# Patient Record
Sex: Female | Born: 1945
Health system: Southern US, Community
[De-identification: ages and names within clinical notes are randomized; demographics above are authoritative.]

## PROBLEM LIST (undated history)

## (undated) DIAGNOSIS — M1990 Unspecified osteoarthritis, unspecified site: Secondary | ICD-10-CM

## (undated) DIAGNOSIS — K219 Gastro-esophageal reflux disease without esophagitis: Secondary | ICD-10-CM

## (undated) DIAGNOSIS — D696 Thrombocytopenia, unspecified: Secondary | ICD-10-CM

## (undated) DIAGNOSIS — E785 Hyperlipidemia, unspecified: Secondary | ICD-10-CM

## (undated) DIAGNOSIS — E079 Disorder of thyroid, unspecified: Secondary | ICD-10-CM

## (undated) DIAGNOSIS — E0590 Thyrotoxicosis, unspecified without thyrotoxic crisis or storm: Secondary | ICD-10-CM

## (undated) DIAGNOSIS — G4730 Sleep apnea, unspecified: Secondary | ICD-10-CM

## (undated) HISTORY — PX: SHOULDER ARTHROSCOPY: SHX128

## (undated) HISTORY — DX: Unspecified osteoarthritis, unspecified site: M19.90

## (undated) HISTORY — DX: Thyrotoxicosis, unspecified without thyrotoxic crisis or storm: E05.90

## (undated) HISTORY — DX: Thrombocytopenia, unspecified: D69.6

## (undated) HISTORY — PX: CERVICAL ABLATION: SHX5771

## (undated) HISTORY — PX: CATARACT EXTRACTION W/ INTRAOCULAR LENS  IMPLANT, BILATERAL: SHX1307

## (undated) HISTORY — DX: Hyperlipidemia, unspecified: E78.5

## (undated) HISTORY — DX: Sleep apnea, unspecified: G47.30

## (undated) HISTORY — PX: COLONOSCOPY: SHX174

---

## 1998-06-02 ENCOUNTER — Other Ambulatory Visit: Admission: RE | Admit: 1998-06-02 | Discharge: 1998-06-02 | Payer: Self-pay | Admitting: Gynecology

## 1998-07-13 ENCOUNTER — Other Ambulatory Visit: Admission: RE | Admit: 1998-07-13 | Discharge: 1998-07-13 | Payer: Self-pay | Admitting: Gynecology

## 1999-06-08 ENCOUNTER — Other Ambulatory Visit: Admission: RE | Admit: 1999-06-08 | Discharge: 1999-06-08 | Payer: Self-pay | Admitting: Gynecology

## 1999-06-23 ENCOUNTER — Encounter: Payer: Self-pay | Admitting: Gynecology

## 1999-06-23 ENCOUNTER — Encounter: Admission: RE | Admit: 1999-06-23 | Discharge: 1999-06-23 | Payer: Self-pay | Admitting: Gynecology

## 1999-06-28 ENCOUNTER — Other Ambulatory Visit: Admission: RE | Admit: 1999-06-28 | Discharge: 1999-06-28 | Payer: Self-pay | Admitting: Gynecology

## 2000-03-08 ENCOUNTER — Encounter: Payer: Self-pay | Admitting: Internal Medicine

## 2000-03-08 ENCOUNTER — Encounter: Admission: RE | Admit: 2000-03-08 | Discharge: 2000-03-08 | Payer: Self-pay | Admitting: Internal Medicine

## 2000-06-07 ENCOUNTER — Other Ambulatory Visit: Admission: RE | Admit: 2000-06-07 | Discharge: 2000-06-07 | Payer: Self-pay | Admitting: Gynecology

## 2000-07-07 ENCOUNTER — Encounter: Payer: Self-pay | Admitting: Gynecology

## 2000-07-07 ENCOUNTER — Encounter: Admission: RE | Admit: 2000-07-07 | Discharge: 2000-07-07 | Payer: Self-pay | Admitting: Gynecology

## 2000-11-11 ENCOUNTER — Emergency Department (HOSPITAL_COMMUNITY): Admission: EM | Admit: 2000-11-11 | Discharge: 2000-11-11 | Payer: Self-pay | Admitting: *Deleted

## 2000-11-11 ENCOUNTER — Encounter: Payer: Self-pay | Admitting: *Deleted

## 2001-07-17 ENCOUNTER — Other Ambulatory Visit: Admission: RE | Admit: 2001-07-17 | Discharge: 2001-07-17 | Payer: Self-pay | Admitting: Gynecology

## 2001-07-27 ENCOUNTER — Encounter: Admission: RE | Admit: 2001-07-27 | Discharge: 2001-07-27 | Payer: Self-pay | Admitting: Gynecology

## 2001-07-27 ENCOUNTER — Encounter: Payer: Self-pay | Admitting: Gynecology

## 2002-05-28 ENCOUNTER — Encounter: Admission: RE | Admit: 2002-05-28 | Discharge: 2002-05-28 | Payer: Self-pay | Admitting: Orthopaedic Surgery

## 2002-05-28 ENCOUNTER — Encounter: Payer: Self-pay | Admitting: Orthopaedic Surgery

## 2002-07-31 ENCOUNTER — Encounter: Admission: RE | Admit: 2002-07-31 | Discharge: 2002-07-31 | Payer: Self-pay | Admitting: Gynecology

## 2002-07-31 ENCOUNTER — Encounter: Payer: Self-pay | Admitting: Gynecology

## 2002-08-06 ENCOUNTER — Other Ambulatory Visit: Admission: RE | Admit: 2002-08-06 | Discharge: 2002-08-06 | Payer: Self-pay | Admitting: Gynecology

## 2003-06-24 ENCOUNTER — Other Ambulatory Visit: Admission: RE | Admit: 2003-06-24 | Discharge: 2003-06-24 | Payer: Self-pay | Admitting: Gynecology

## 2003-07-09 ENCOUNTER — Encounter: Admission: RE | Admit: 2003-07-09 | Discharge: 2003-07-09 | Payer: Self-pay | Admitting: Gynecology

## 2003-08-11 ENCOUNTER — Encounter: Admission: RE | Admit: 2003-08-11 | Discharge: 2003-08-11 | Payer: Self-pay | Admitting: Gynecology

## 2004-06-29 ENCOUNTER — Other Ambulatory Visit: Admission: RE | Admit: 2004-06-29 | Discharge: 2004-06-29 | Payer: Self-pay | Admitting: Gynecology

## 2004-08-11 ENCOUNTER — Encounter: Admission: RE | Admit: 2004-08-11 | Discharge: 2004-08-11 | Payer: Self-pay | Admitting: Gynecology

## 2005-08-09 ENCOUNTER — Other Ambulatory Visit: Admission: RE | Admit: 2005-08-09 | Discharge: 2005-08-09 | Payer: Self-pay | Admitting: Gynecology

## 2005-08-15 ENCOUNTER — Encounter: Admission: RE | Admit: 2005-08-15 | Discharge: 2005-08-15 | Payer: Self-pay | Admitting: Gynecology

## 2005-08-17 ENCOUNTER — Encounter: Admission: RE | Admit: 2005-08-17 | Discharge: 2005-08-17 | Payer: Self-pay | Admitting: Internal Medicine

## 2006-08-08 ENCOUNTER — Other Ambulatory Visit: Admission: RE | Admit: 2006-08-08 | Discharge: 2006-08-08 | Payer: Self-pay | Admitting: Gynecology

## 2006-08-17 ENCOUNTER — Encounter: Admission: RE | Admit: 2006-08-17 | Discharge: 2006-08-17 | Payer: Self-pay | Admitting: Gynecology

## 2007-08-21 ENCOUNTER — Encounter: Admission: RE | Admit: 2007-08-21 | Discharge: 2007-08-21 | Payer: Self-pay | Admitting: Gynecology

## 2007-09-11 ENCOUNTER — Other Ambulatory Visit: Admission: RE | Admit: 2007-09-11 | Discharge: 2007-09-11 | Payer: Self-pay | Admitting: Gynecology

## 2007-09-20 ENCOUNTER — Encounter: Admission: RE | Admit: 2007-09-20 | Discharge: 2007-09-20 | Payer: Self-pay | Admitting: Gynecology

## 2008-09-05 ENCOUNTER — Encounter: Admission: RE | Admit: 2008-09-05 | Discharge: 2008-09-05 | Payer: Self-pay | Admitting: Gynecology

## 2009-09-16 ENCOUNTER — Encounter: Admission: RE | Admit: 2009-09-16 | Discharge: 2009-09-16 | Payer: Self-pay | Admitting: Gynecology

## 2011-05-31 DIAGNOSIS — D235 Other benign neoplasm of skin of trunk: Secondary | ICD-10-CM | POA: Diagnosis not present

## 2011-05-31 DIAGNOSIS — L570 Actinic keratosis: Secondary | ICD-10-CM | POA: Diagnosis not present

## 2011-06-21 DIAGNOSIS — H52209 Unspecified astigmatism, unspecified eye: Secondary | ICD-10-CM | POA: Diagnosis not present

## 2011-06-21 DIAGNOSIS — H16229 Keratoconjunctivitis sicca, not specified as Sjogren's, unspecified eye: Secondary | ICD-10-CM | POA: Diagnosis not present

## 2011-06-21 DIAGNOSIS — H524 Presbyopia: Secondary | ICD-10-CM | POA: Diagnosis not present

## 2011-06-21 DIAGNOSIS — H25019 Cortical age-related cataract, unspecified eye: Secondary | ICD-10-CM | POA: Diagnosis not present

## 2011-10-03 DIAGNOSIS — E559 Vitamin D deficiency, unspecified: Secondary | ICD-10-CM | POA: Diagnosis not present

## 2011-10-03 DIAGNOSIS — R82998 Other abnormal findings in urine: Secondary | ICD-10-CM | POA: Diagnosis not present

## 2011-10-03 DIAGNOSIS — E785 Hyperlipidemia, unspecified: Secondary | ICD-10-CM | POA: Diagnosis not present

## 2011-10-05 DIAGNOSIS — K5900 Constipation, unspecified: Secondary | ICD-10-CM | POA: Diagnosis not present

## 2011-10-05 DIAGNOSIS — E785 Hyperlipidemia, unspecified: Secondary | ICD-10-CM | POA: Diagnosis not present

## 2011-10-05 DIAGNOSIS — Z0000 Encounter for general adult medical examination without abnormal findings: Secondary | ICD-10-CM | POA: Diagnosis not present

## 2011-10-05 DIAGNOSIS — M899 Disorder of bone, unspecified: Secondary | ICD-10-CM | POA: Diagnosis not present

## 2011-10-13 DIAGNOSIS — Z1231 Encounter for screening mammogram for malignant neoplasm of breast: Secondary | ICD-10-CM | POA: Diagnosis not present

## 2011-10-13 DIAGNOSIS — Z1212 Encounter for screening for malignant neoplasm of rectum: Secondary | ICD-10-CM | POA: Diagnosis not present

## 2011-10-13 DIAGNOSIS — Z124 Encounter for screening for malignant neoplasm of cervix: Secondary | ICD-10-CM | POA: Diagnosis not present

## 2011-10-18 DIAGNOSIS — M949 Disorder of cartilage, unspecified: Secondary | ICD-10-CM | POA: Diagnosis not present

## 2011-10-18 DIAGNOSIS — M899 Disorder of bone, unspecified: Secondary | ICD-10-CM | POA: Diagnosis not present

## 2011-10-19 DIAGNOSIS — D485 Neoplasm of uncertain behavior of skin: Secondary | ICD-10-CM | POA: Diagnosis not present

## 2011-10-19 DIAGNOSIS — L570 Actinic keratosis: Secondary | ICD-10-CM | POA: Diagnosis not present

## 2012-02-21 DIAGNOSIS — Z23 Encounter for immunization: Secondary | ICD-10-CM | POA: Diagnosis not present

## 2012-02-21 DIAGNOSIS — D485 Neoplasm of uncertain behavior of skin: Secondary | ICD-10-CM | POA: Diagnosis not present

## 2012-04-24 DIAGNOSIS — R05 Cough: Secondary | ICD-10-CM | POA: Diagnosis not present

## 2012-04-24 DIAGNOSIS — J0100 Acute maxillary sinusitis, unspecified: Secondary | ICD-10-CM | POA: Diagnosis not present

## 2012-05-28 DIAGNOSIS — D235 Other benign neoplasm of skin of trunk: Secondary | ICD-10-CM | POA: Diagnosis not present

## 2012-05-28 DIAGNOSIS — D485 Neoplasm of uncertain behavior of skin: Secondary | ICD-10-CM | POA: Diagnosis not present

## 2012-06-05 DIAGNOSIS — H524 Presbyopia: Secondary | ICD-10-CM | POA: Diagnosis not present

## 2012-06-05 DIAGNOSIS — H16229 Keratoconjunctivitis sicca, not specified as Sjogren's, unspecified eye: Secondary | ICD-10-CM | POA: Diagnosis not present

## 2012-06-05 DIAGNOSIS — H25019 Cortical age-related cataract, unspecified eye: Secondary | ICD-10-CM | POA: Diagnosis not present

## 2012-10-02 DIAGNOSIS — E785 Hyperlipidemia, unspecified: Secondary | ICD-10-CM | POA: Diagnosis not present

## 2012-10-02 DIAGNOSIS — M949 Disorder of cartilage, unspecified: Secondary | ICD-10-CM | POA: Diagnosis not present

## 2012-10-02 DIAGNOSIS — R82998 Other abnormal findings in urine: Secondary | ICD-10-CM | POA: Diagnosis not present

## 2012-10-02 DIAGNOSIS — M899 Disorder of bone, unspecified: Secondary | ICD-10-CM | POA: Diagnosis not present

## 2012-10-02 DIAGNOSIS — Z79899 Other long term (current) drug therapy: Secondary | ICD-10-CM | POA: Diagnosis not present

## 2012-10-10 DIAGNOSIS — M545 Low back pain: Secondary | ICD-10-CM | POA: Diagnosis not present

## 2012-10-10 DIAGNOSIS — E785 Hyperlipidemia, unspecified: Secondary | ICD-10-CM | POA: Diagnosis not present

## 2012-10-10 DIAGNOSIS — Z1331 Encounter for screening for depression: Secondary | ICD-10-CM | POA: Diagnosis not present

## 2012-10-10 DIAGNOSIS — K5900 Constipation, unspecified: Secondary | ICD-10-CM | POA: Diagnosis not present

## 2012-10-10 DIAGNOSIS — Z79899 Other long term (current) drug therapy: Secondary | ICD-10-CM | POA: Diagnosis not present

## 2012-10-10 DIAGNOSIS — M899 Disorder of bone, unspecified: Secondary | ICD-10-CM | POA: Diagnosis not present

## 2012-10-10 DIAGNOSIS — Z0000 Encounter for general adult medical examination without abnormal findings: Secondary | ICD-10-CM | POA: Diagnosis not present

## 2012-10-10 DIAGNOSIS — K219 Gastro-esophageal reflux disease without esophagitis: Secondary | ICD-10-CM | POA: Diagnosis not present

## 2012-11-27 DIAGNOSIS — Z124 Encounter for screening for malignant neoplasm of cervix: Secondary | ICD-10-CM | POA: Diagnosis not present

## 2012-11-27 DIAGNOSIS — Z1231 Encounter for screening mammogram for malignant neoplasm of breast: Secondary | ICD-10-CM | POA: Diagnosis not present

## 2012-12-11 DIAGNOSIS — L821 Other seborrheic keratosis: Secondary | ICD-10-CM | POA: Diagnosis not present

## 2012-12-25 DIAGNOSIS — H25019 Cortical age-related cataract, unspecified eye: Secondary | ICD-10-CM | POA: Diagnosis not present

## 2012-12-25 DIAGNOSIS — H52229 Regular astigmatism, unspecified eye: Secondary | ICD-10-CM | POA: Diagnosis not present

## 2012-12-25 DIAGNOSIS — H5210 Myopia, unspecified eye: Secondary | ICD-10-CM | POA: Diagnosis not present

## 2012-12-25 DIAGNOSIS — H16229 Keratoconjunctivitis sicca, not specified as Sjogren's, unspecified eye: Secondary | ICD-10-CM | POA: Diagnosis not present

## 2013-02-07 DIAGNOSIS — Z23 Encounter for immunization: Secondary | ICD-10-CM | POA: Diagnosis not present

## 2013-05-21 DIAGNOSIS — R42 Dizziness and giddiness: Secondary | ICD-10-CM | POA: Diagnosis not present

## 2013-06-17 DIAGNOSIS — L821 Other seborrheic keratosis: Secondary | ICD-10-CM | POA: Diagnosis not present

## 2013-06-17 DIAGNOSIS — L820 Inflamed seborrheic keratosis: Secondary | ICD-10-CM | POA: Diagnosis not present

## 2013-10-10 DIAGNOSIS — M949 Disorder of cartilage, unspecified: Secondary | ICD-10-CM | POA: Diagnosis not present

## 2013-10-10 DIAGNOSIS — N183 Chronic kidney disease, stage 3 unspecified: Secondary | ICD-10-CM | POA: Diagnosis not present

## 2013-10-10 DIAGNOSIS — R809 Proteinuria, unspecified: Secondary | ICD-10-CM | POA: Diagnosis not present

## 2013-10-10 DIAGNOSIS — M899 Disorder of bone, unspecified: Secondary | ICD-10-CM | POA: Diagnosis not present

## 2013-10-10 DIAGNOSIS — E785 Hyperlipidemia, unspecified: Secondary | ICD-10-CM | POA: Diagnosis not present

## 2013-10-10 DIAGNOSIS — R82998 Other abnormal findings in urine: Secondary | ICD-10-CM | POA: Diagnosis not present

## 2013-10-17 DIAGNOSIS — E785 Hyperlipidemia, unspecified: Secondary | ICD-10-CM | POA: Diagnosis not present

## 2013-10-17 DIAGNOSIS — M545 Low back pain, unspecified: Secondary | ICD-10-CM | POA: Diagnosis not present

## 2013-10-17 DIAGNOSIS — Z0000 Encounter for general adult medical examination without abnormal findings: Secondary | ICD-10-CM | POA: Diagnosis not present

## 2013-10-17 DIAGNOSIS — R05 Cough: Secondary | ICD-10-CM | POA: Diagnosis not present

## 2013-10-17 DIAGNOSIS — M899 Disorder of bone, unspecified: Secondary | ICD-10-CM | POA: Diagnosis not present

## 2013-10-17 DIAGNOSIS — Z1331 Encounter for screening for depression: Secondary | ICD-10-CM | POA: Diagnosis not present

## 2013-10-17 DIAGNOSIS — J029 Acute pharyngitis, unspecified: Secondary | ICD-10-CM | POA: Diagnosis not present

## 2013-10-17 DIAGNOSIS — N183 Chronic kidney disease, stage 3 unspecified: Secondary | ICD-10-CM | POA: Diagnosis not present

## 2013-10-17 DIAGNOSIS — R059 Cough, unspecified: Secondary | ICD-10-CM | POA: Diagnosis not present

## 2013-10-17 DIAGNOSIS — R42 Dizziness and giddiness: Secondary | ICD-10-CM | POA: Diagnosis not present

## 2013-10-17 DIAGNOSIS — M949 Disorder of cartilage, unspecified: Secondary | ICD-10-CM | POA: Diagnosis not present

## 2013-10-21 DIAGNOSIS — Z1212 Encounter for screening for malignant neoplasm of rectum: Secondary | ICD-10-CM | POA: Diagnosis not present

## 2013-10-29 DIAGNOSIS — D485 Neoplasm of uncertain behavior of skin: Secondary | ICD-10-CM | POA: Diagnosis not present

## 2013-11-27 DIAGNOSIS — L253 Unspecified contact dermatitis due to other chemical products: Secondary | ICD-10-CM | POA: Diagnosis not present

## 2013-12-03 DIAGNOSIS — H2589 Other age-related cataract: Secondary | ICD-10-CM | POA: Diagnosis not present

## 2013-12-03 DIAGNOSIS — H52229 Regular astigmatism, unspecified eye: Secondary | ICD-10-CM | POA: Diagnosis not present

## 2013-12-03 DIAGNOSIS — H5210 Myopia, unspecified eye: Secondary | ICD-10-CM | POA: Diagnosis not present

## 2013-12-03 DIAGNOSIS — H524 Presbyopia: Secondary | ICD-10-CM | POA: Diagnosis not present

## 2013-12-18 DIAGNOSIS — Z1231 Encounter for screening mammogram for malignant neoplasm of breast: Secondary | ICD-10-CM | POA: Diagnosis not present

## 2013-12-18 DIAGNOSIS — N959 Unspecified menopausal and perimenopausal disorder: Secondary | ICD-10-CM | POA: Diagnosis not present

## 2013-12-18 DIAGNOSIS — M949 Disorder of cartilage, unspecified: Secondary | ICD-10-CM | POA: Diagnosis not present

## 2013-12-18 DIAGNOSIS — M899 Disorder of bone, unspecified: Secondary | ICD-10-CM | POA: Diagnosis not present

## 2013-12-18 DIAGNOSIS — Z01419 Encounter for gynecological examination (general) (routine) without abnormal findings: Secondary | ICD-10-CM | POA: Diagnosis not present

## 2013-12-20 DIAGNOSIS — D235 Other benign neoplasm of skin of trunk: Secondary | ICD-10-CM | POA: Diagnosis not present

## 2013-12-20 DIAGNOSIS — D1801 Hemangioma of skin and subcutaneous tissue: Secondary | ICD-10-CM | POA: Diagnosis not present

## 2013-12-20 DIAGNOSIS — D485 Neoplasm of uncertain behavior of skin: Secondary | ICD-10-CM | POA: Diagnosis not present

## 2014-02-07 DIAGNOSIS — Z23 Encounter for immunization: Secondary | ICD-10-CM | POA: Diagnosis not present

## 2014-06-26 DIAGNOSIS — J0100 Acute maxillary sinusitis, unspecified: Secondary | ICD-10-CM | POA: Diagnosis not present

## 2014-06-26 DIAGNOSIS — Z6821 Body mass index (BMI) 21.0-21.9, adult: Secondary | ICD-10-CM | POA: Diagnosis not present

## 2014-10-17 DIAGNOSIS — E785 Hyperlipidemia, unspecified: Secondary | ICD-10-CM | POA: Diagnosis not present

## 2014-10-17 DIAGNOSIS — N183 Chronic kidney disease, stage 3 (moderate): Secondary | ICD-10-CM | POA: Diagnosis not present

## 2014-10-17 DIAGNOSIS — M859 Disorder of bone density and structure, unspecified: Secondary | ICD-10-CM | POA: Diagnosis not present

## 2014-10-17 DIAGNOSIS — N390 Urinary tract infection, site not specified: Secondary | ICD-10-CM | POA: Diagnosis not present

## 2014-10-17 DIAGNOSIS — R8299 Other abnormal findings in urine: Secondary | ICD-10-CM | POA: Diagnosis not present

## 2014-10-24 DIAGNOSIS — Z1389 Encounter for screening for other disorder: Secondary | ICD-10-CM | POA: Diagnosis not present

## 2014-10-24 DIAGNOSIS — Z0000 Encounter for general adult medical examination without abnormal findings: Secondary | ICD-10-CM | POA: Diagnosis not present

## 2014-10-24 DIAGNOSIS — M859 Disorder of bone density and structure, unspecified: Secondary | ICD-10-CM | POA: Diagnosis not present

## 2014-10-24 DIAGNOSIS — K645 Perianal venous thrombosis: Secondary | ICD-10-CM | POA: Diagnosis not present

## 2014-10-24 DIAGNOSIS — K219 Gastro-esophageal reflux disease without esophagitis: Secondary | ICD-10-CM | POA: Diagnosis not present

## 2014-10-24 DIAGNOSIS — M545 Low back pain: Secondary | ICD-10-CM | POA: Diagnosis not present

## 2014-10-24 DIAGNOSIS — Z23 Encounter for immunization: Secondary | ICD-10-CM | POA: Diagnosis not present

## 2014-10-24 DIAGNOSIS — Z1212 Encounter for screening for malignant neoplasm of rectum: Secondary | ICD-10-CM | POA: Diagnosis not present

## 2014-10-24 DIAGNOSIS — Z6820 Body mass index (BMI) 20.0-20.9, adult: Secondary | ICD-10-CM | POA: Diagnosis not present

## 2014-10-24 DIAGNOSIS — E785 Hyperlipidemia, unspecified: Secondary | ICD-10-CM | POA: Diagnosis not present

## 2014-10-24 DIAGNOSIS — K5900 Constipation, unspecified: Secondary | ICD-10-CM | POA: Diagnosis not present

## 2014-12-16 DIAGNOSIS — H524 Presbyopia: Secondary | ICD-10-CM | POA: Diagnosis not present

## 2014-12-16 DIAGNOSIS — H5213 Myopia, bilateral: Secondary | ICD-10-CM | POA: Diagnosis not present

## 2014-12-16 DIAGNOSIS — H52223 Regular astigmatism, bilateral: Secondary | ICD-10-CM | POA: Diagnosis not present

## 2014-12-16 DIAGNOSIS — H25813 Combined forms of age-related cataract, bilateral: Secondary | ICD-10-CM | POA: Diagnosis not present

## 2015-01-14 DIAGNOSIS — L570 Actinic keratosis: Secondary | ICD-10-CM | POA: Diagnosis not present

## 2015-01-14 DIAGNOSIS — D225 Melanocytic nevi of trunk: Secondary | ICD-10-CM | POA: Diagnosis not present

## 2015-01-14 DIAGNOSIS — D485 Neoplasm of uncertain behavior of skin: Secondary | ICD-10-CM | POA: Diagnosis not present

## 2015-01-14 DIAGNOSIS — D1801 Hemangioma of skin and subcutaneous tissue: Secondary | ICD-10-CM | POA: Diagnosis not present

## 2015-01-14 DIAGNOSIS — L821 Other seborrheic keratosis: Secondary | ICD-10-CM | POA: Diagnosis not present

## 2015-01-14 DIAGNOSIS — L814 Other melanin hyperpigmentation: Secondary | ICD-10-CM | POA: Diagnosis not present

## 2015-01-26 DIAGNOSIS — R140 Abdominal distension (gaseous): Secondary | ICD-10-CM | POA: Diagnosis not present

## 2015-01-26 DIAGNOSIS — M5136 Other intervertebral disc degeneration, lumbar region: Secondary | ICD-10-CM | POA: Diagnosis not present

## 2015-01-26 DIAGNOSIS — M5137 Other intervertebral disc degeneration, lumbosacral region: Secondary | ICD-10-CM | POA: Diagnosis not present

## 2015-01-26 DIAGNOSIS — R29898 Other symptoms and signs involving the musculoskeletal system: Secondary | ICD-10-CM | POA: Diagnosis not present

## 2015-01-26 DIAGNOSIS — K449 Diaphragmatic hernia without obstruction or gangrene: Secondary | ICD-10-CM | POA: Diagnosis not present

## 2015-01-26 DIAGNOSIS — Z23 Encounter for immunization: Secondary | ICD-10-CM | POA: Diagnosis not present

## 2015-01-26 DIAGNOSIS — R251 Tremor, unspecified: Secondary | ICD-10-CM | POA: Diagnosis not present

## 2015-01-26 DIAGNOSIS — Z6820 Body mass index (BMI) 20.0-20.9, adult: Secondary | ICD-10-CM | POA: Diagnosis not present

## 2015-01-28 DIAGNOSIS — R140 Abdominal distension (gaseous): Secondary | ICD-10-CM | POA: Diagnosis not present

## 2015-01-28 DIAGNOSIS — K449 Diaphragmatic hernia without obstruction or gangrene: Secondary | ICD-10-CM | POA: Diagnosis not present

## 2015-01-29 DIAGNOSIS — Z6820 Body mass index (BMI) 20.0-20.9, adult: Secondary | ICD-10-CM | POA: Diagnosis not present

## 2015-01-29 DIAGNOSIS — Z1231 Encounter for screening mammogram for malignant neoplasm of breast: Secondary | ICD-10-CM | POA: Diagnosis not present

## 2015-01-29 DIAGNOSIS — Z124 Encounter for screening for malignant neoplasm of cervix: Secondary | ICD-10-CM | POA: Diagnosis not present

## 2015-01-29 DIAGNOSIS — Z1212 Encounter for screening for malignant neoplasm of rectum: Secondary | ICD-10-CM | POA: Diagnosis not present

## 2015-02-18 ENCOUNTER — Other Ambulatory Visit: Payer: Self-pay | Admitting: Internal Medicine

## 2015-02-18 DIAGNOSIS — R140 Abdominal distension (gaseous): Secondary | ICD-10-CM | POA: Diagnosis not present

## 2015-02-18 DIAGNOSIS — R634 Abnormal weight loss: Secondary | ICD-10-CM | POA: Diagnosis not present

## 2015-02-18 DIAGNOSIS — Z6820 Body mass index (BMI) 20.0-20.9, adult: Secondary | ICD-10-CM | POA: Diagnosis not present

## 2015-02-18 DIAGNOSIS — R05 Cough: Secondary | ICD-10-CM | POA: Diagnosis not present

## 2015-02-18 DIAGNOSIS — R1310 Dysphagia, unspecified: Secondary | ICD-10-CM

## 2015-02-18 DIAGNOSIS — M545 Low back pain: Secondary | ICD-10-CM | POA: Diagnosis not present

## 2015-02-24 ENCOUNTER — Ambulatory Visit
Admission: RE | Admit: 2015-02-24 | Discharge: 2015-02-24 | Disposition: A | Discharge status: Home / Self Care | Payer: Medicare Other | Source: Ambulatory Visit | Attending: Internal Medicine | Admitting: Internal Medicine

## 2015-02-24 DIAGNOSIS — R140 Abdominal distension (gaseous): Secondary | ICD-10-CM

## 2015-02-24 DIAGNOSIS — R634 Abnormal weight loss: Secondary | ICD-10-CM | POA: Diagnosis not present

## 2015-02-24 MED ORDER — IOPAMIDOL (ISOVUE-300) INJECTION 61%
100.0000 mL | Freq: Once | INTRAVENOUS | Status: AC | PRN
  Administered 2015-02-24: 100 mL via INTRAVENOUS

## 2015-02-26 ENCOUNTER — Other Ambulatory Visit: Payer: Self-pay | Admitting: Internal Medicine

## 2015-02-26 DIAGNOSIS — R1310 Dysphagia, unspecified: Secondary | ICD-10-CM

## 2015-02-27 ENCOUNTER — Ambulatory Visit
Admission: RE | Admit: 2015-02-27 | Discharge: 2015-02-27 | Disposition: A | Discharge status: Home / Self Care | Payer: Medicare Other | Source: Ambulatory Visit | Attending: Internal Medicine | Admitting: Internal Medicine

## 2015-02-27 DIAGNOSIS — K224 Dyskinesia of esophagus: Secondary | ICD-10-CM | POA: Diagnosis not present

## 2015-02-27 DIAGNOSIS — R1310 Dysphagia, unspecified: Secondary | ICD-10-CM

## 2015-03-20 DIAGNOSIS — R634 Abnormal weight loss: Secondary | ICD-10-CM | POA: Diagnosis not present

## 2015-03-20 DIAGNOSIS — R140 Abdominal distension (gaseous): Secondary | ICD-10-CM | POA: Diagnosis not present

## 2015-03-20 DIAGNOSIS — Z6821 Body mass index (BMI) 21.0-21.9, adult: Secondary | ICD-10-CM | POA: Diagnosis not present

## 2015-03-20 DIAGNOSIS — E0590 Thyrotoxicosis, unspecified without thyrotoxic crisis or storm: Secondary | ICD-10-CM | POA: Diagnosis not present

## 2015-07-07 DIAGNOSIS — E0590 Thyrotoxicosis, unspecified without thyrotoxic crisis or storm: Secondary | ICD-10-CM | POA: Diagnosis not present

## 2015-07-07 DIAGNOSIS — Z6821 Body mass index (BMI) 21.0-21.9, adult: Secondary | ICD-10-CM | POA: Diagnosis not present

## 2015-07-07 DIAGNOSIS — R1310 Dysphagia, unspecified: Secondary | ICD-10-CM | POA: Diagnosis not present

## 2015-09-02 DIAGNOSIS — D1801 Hemangioma of skin and subcutaneous tissue: Secondary | ICD-10-CM | POA: Diagnosis not present

## 2015-09-02 DIAGNOSIS — L820 Inflamed seborrheic keratosis: Secondary | ICD-10-CM | POA: Diagnosis not present

## 2015-09-02 DIAGNOSIS — L821 Other seborrheic keratosis: Secondary | ICD-10-CM | POA: Diagnosis not present

## 2015-09-02 DIAGNOSIS — L570 Actinic keratosis: Secondary | ICD-10-CM | POA: Diagnosis not present

## 2015-10-02 DIAGNOSIS — L820 Inflamed seborrheic keratosis: Secondary | ICD-10-CM | POA: Diagnosis not present

## 2015-10-02 DIAGNOSIS — L570 Actinic keratosis: Secondary | ICD-10-CM | POA: Diagnosis not present

## 2015-11-04 DIAGNOSIS — N183 Chronic kidney disease, stage 3 (moderate): Secondary | ICD-10-CM | POA: Diagnosis not present

## 2015-11-04 DIAGNOSIS — E784 Other hyperlipidemia: Secondary | ICD-10-CM | POA: Diagnosis not present

## 2015-11-04 DIAGNOSIS — N390 Urinary tract infection, site not specified: Secondary | ICD-10-CM | POA: Diagnosis not present

## 2015-11-04 DIAGNOSIS — E0590 Thyrotoxicosis, unspecified without thyrotoxic crisis or storm: Secondary | ICD-10-CM | POA: Diagnosis not present

## 2015-11-04 DIAGNOSIS — M859 Disorder of bone density and structure, unspecified: Secondary | ICD-10-CM | POA: Diagnosis not present

## 2015-11-04 DIAGNOSIS — R8299 Other abnormal findings in urine: Secondary | ICD-10-CM | POA: Diagnosis not present

## 2015-11-06 DIAGNOSIS — E0590 Thyrotoxicosis, unspecified without thyrotoxic crisis or storm: Secondary | ICD-10-CM | POA: Diagnosis not present

## 2015-11-06 DIAGNOSIS — K5909 Other constipation: Secondary | ICD-10-CM | POA: Diagnosis not present

## 2015-11-06 DIAGNOSIS — E784 Other hyperlipidemia: Secondary | ICD-10-CM | POA: Diagnosis not present

## 2015-11-06 DIAGNOSIS — N390 Urinary tract infection, site not specified: Secondary | ICD-10-CM | POA: Diagnosis not present

## 2015-11-06 DIAGNOSIS — Z1389 Encounter for screening for other disorder: Secondary | ICD-10-CM | POA: Diagnosis not present

## 2015-11-06 DIAGNOSIS — Z6821 Body mass index (BMI) 21.0-21.9, adult: Secondary | ICD-10-CM | POA: Diagnosis not present

## 2015-11-06 DIAGNOSIS — N183 Chronic kidney disease, stage 3 (moderate): Secondary | ICD-10-CM | POA: Diagnosis not present

## 2015-11-06 DIAGNOSIS — M859 Disorder of bone density and structure, unspecified: Secondary | ICD-10-CM | POA: Diagnosis not present

## 2015-11-06 DIAGNOSIS — R1319 Other dysphagia: Secondary | ICD-10-CM | POA: Diagnosis not present

## 2015-11-06 DIAGNOSIS — M545 Low back pain: Secondary | ICD-10-CM | POA: Diagnosis not present

## 2015-11-06 DIAGNOSIS — Z0000 Encounter for general adult medical examination without abnormal findings: Secondary | ICD-10-CM | POA: Diagnosis not present

## 2015-11-10 IMAGING — CT CT ABD-PELV W/ CM
2 of 4 series · 15 of 46 positions shown, 17 images · IV contrast (APPLIED)
Comparison: None.

CLINICAL DATA: Abdominal bloating, 15 pound weight loss, hiatal
hernia

EXAM:
CT ABDOMEN AND PELVIS WITH CONTRAST
TECHNIQUE: Multidetector CT imaging of the abdomen and pelvis was performed
using the standard protocol following bolus administration of
intravenous contrast.
CONTRAST:  100mL [id number]-MVV IOPAMIDOL (V5FZLP-MVV) INJECTION 61%

[Series 2: abd/pelvis w/cm · axial · 0.69mm/px · z∈[-500,-75]mm · 12 of 93 slices shown, 14 images]
[im 4/93  soft-tissue]
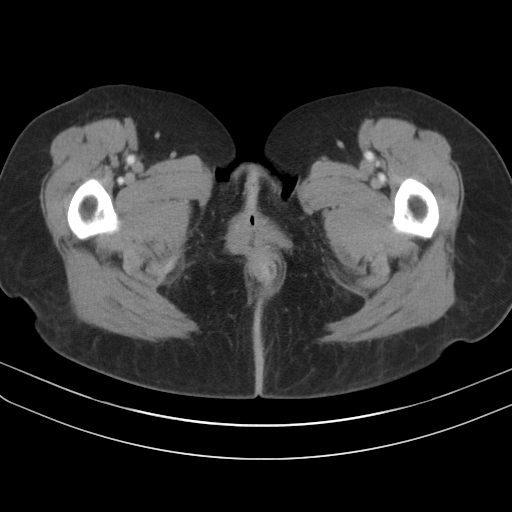
[im 4/93  bone]
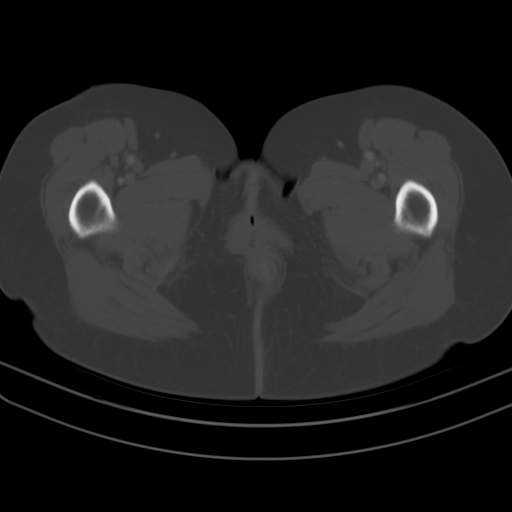
[im 12/93  soft-tissue]
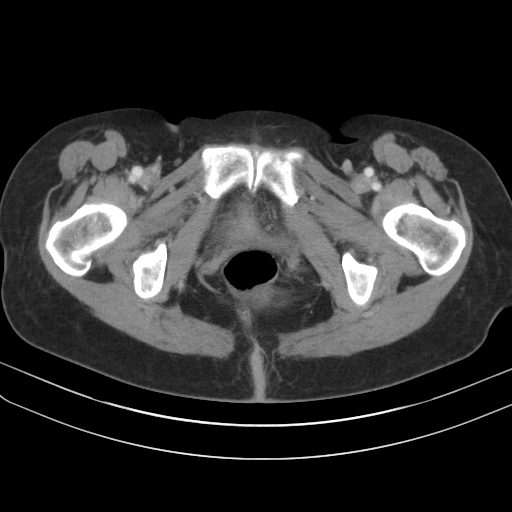
[im 20/93  soft-tissue]
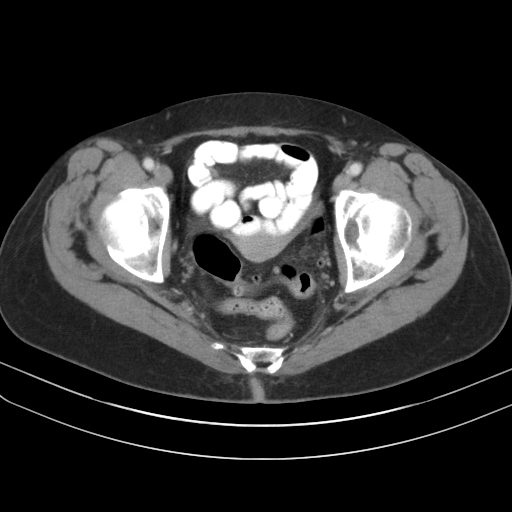
[im 27/93  soft-tissue]
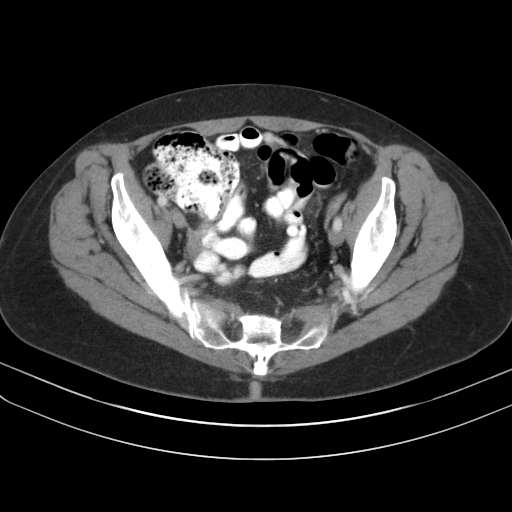
[im 35/93  soft-tissue]
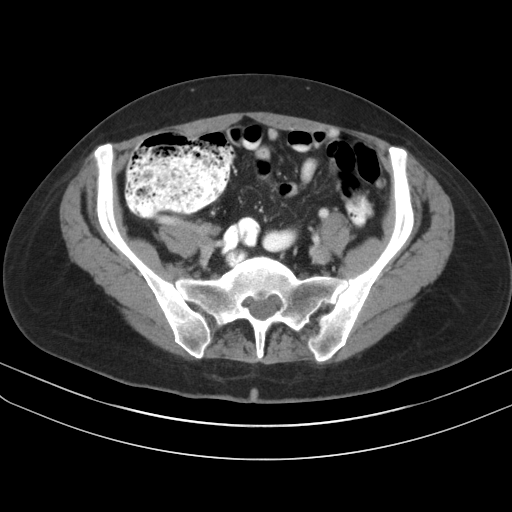
[im 43/93  soft-tissue]
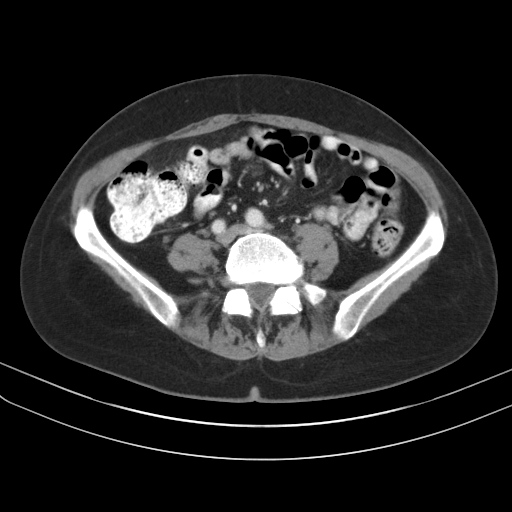
[im 50/93  soft-tissue]
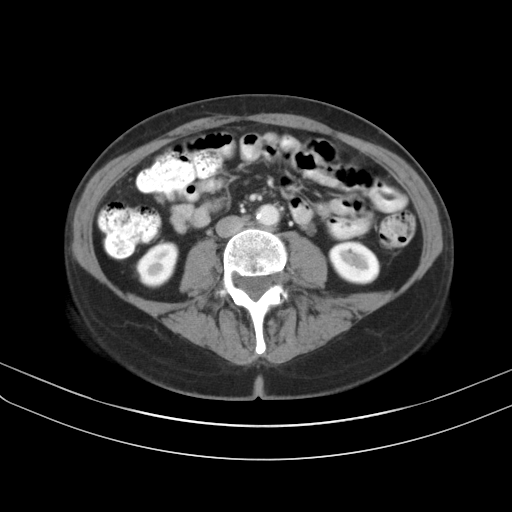
[im 58/93  soft-tissue]
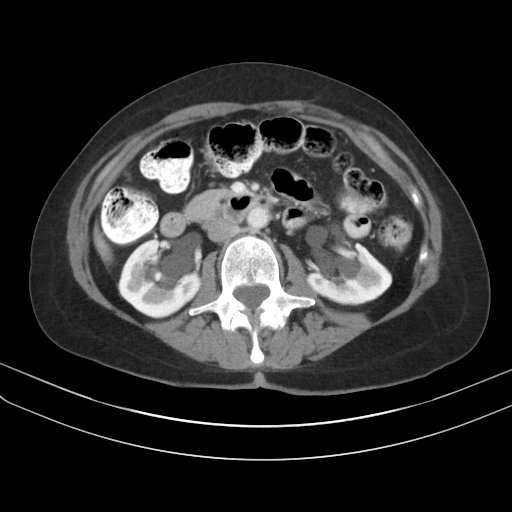
[im 66/93  soft-tissue]
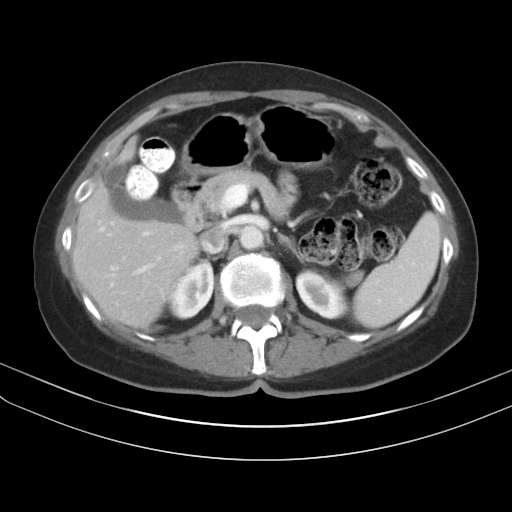
[im 66/93  bone]
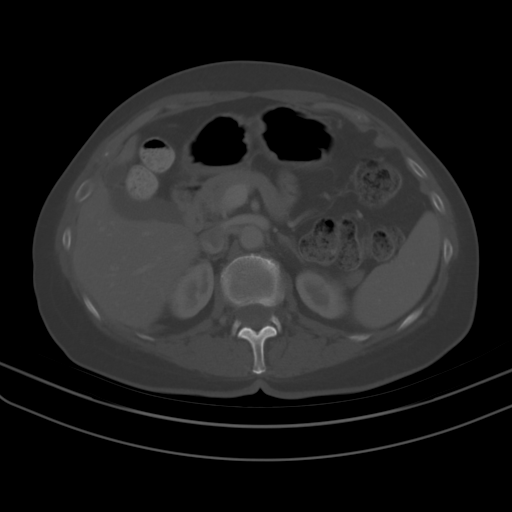
[im 73/93  soft-tissue]
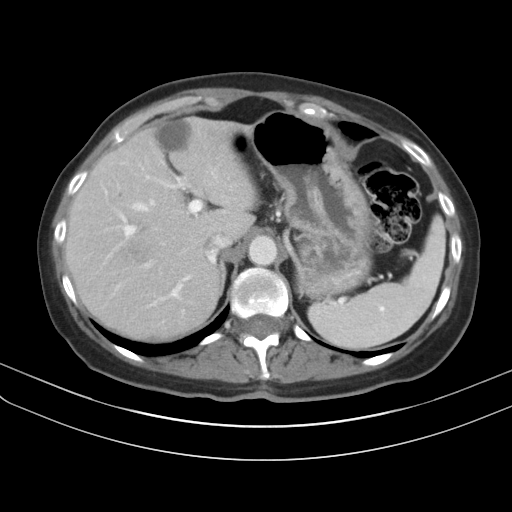
[im 81/93  soft-tissue]
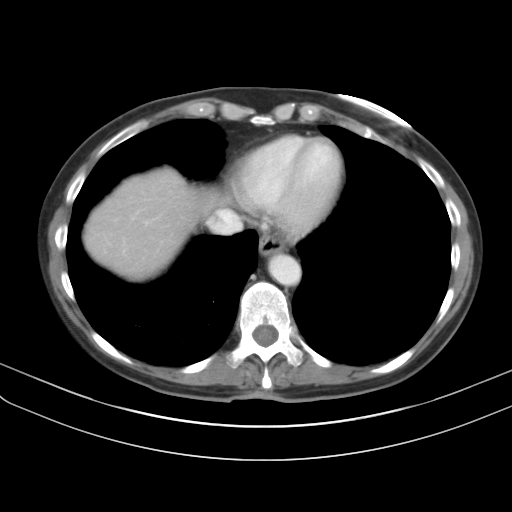
[im 89/93  soft-tissue]
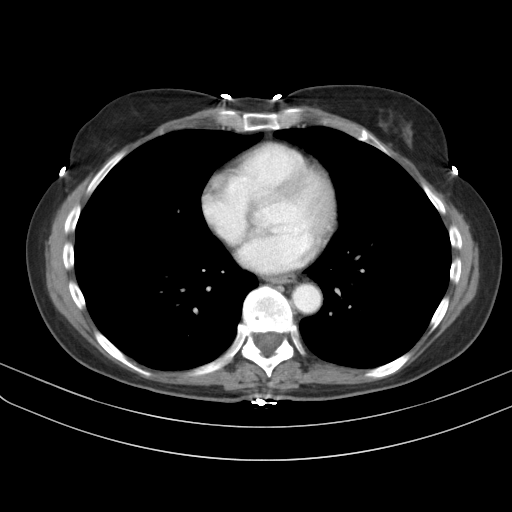

[Series 3: cor · coronal · 0.59mm/px · 3 of 77 slices shown]
[im 26/77  soft-tissue]
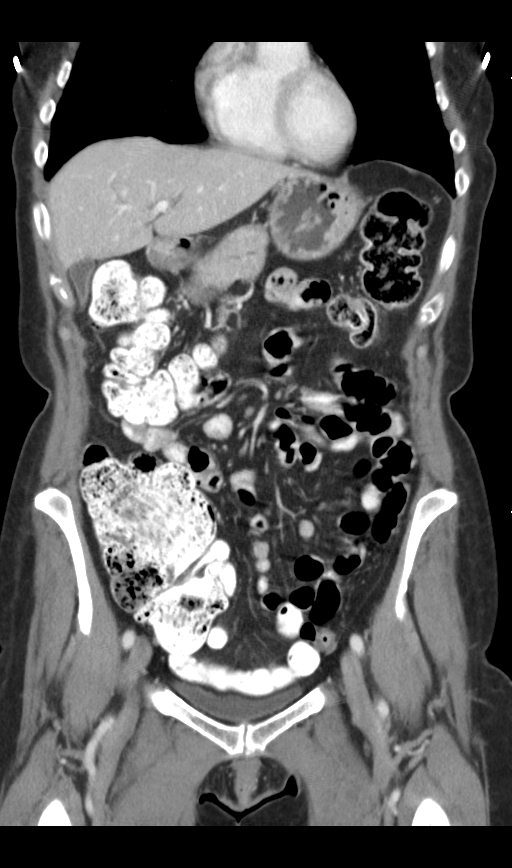
[im 34/77  soft-tissue]
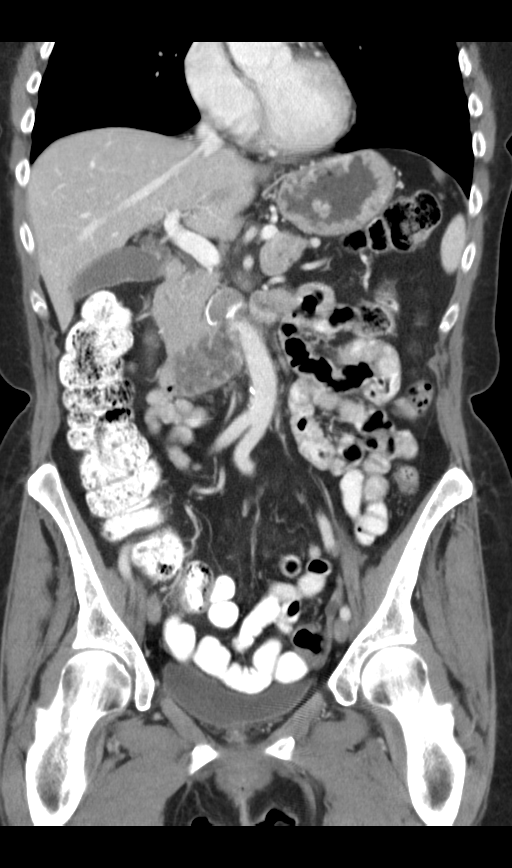
[im 43/77  soft-tissue]
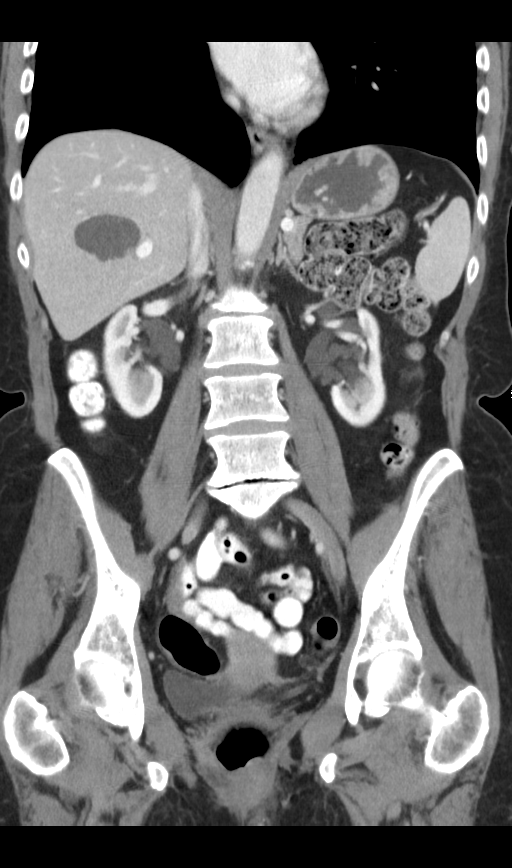

[15 of 46 positions shown; findings below may reference images not displayed]

FINDINGS: Lower chest:  Lung bases are clear.

Hepatobiliary: Multiple hepatic cysts, including a 3.6 cm mildly
complex/ septated cyst in the central right hepatic lobe (series 2/
image 25), benign.

Gallbladder unremarkable. No intrahepatic or extrahepatic ductal
dilatation.

Pancreas: Within normal limits.

Spleen: Within normal limits.

Adrenals/Urinary Tract: Adrenal glands are within normal limits.

Kidneys are notable for bilateral renal sinus cysts. No
hydronephrosis.

Bladder is within normal limits.

Stomach/Bowel: Stomach is within normal limits.

No evidence of bowel obstruction.

Duodenal diverticulum (series 2/image 32).

Normal appendix (series 2/ image 56).

Colonic diverticulosis, without evidence of diverticulitis.

Vascular/Lymphatic: Mild atherosclerotic calcifications of the
abdominal aorta.

No suspicious abdominopelvic lymphadenopathy.

Reproductive: Uterus is within normal limits.

Bilateral ovaries are unremarkable.

Other: No abdominopelvic ascites.

Musculoskeletal: Mild degenerative changes of the visualized
thoracolumbar spine.
IMPRESSION: No evidence of bowel obstruction.  Normal appendix.

No CT findings to account for the patient's abdominal pain/ weight
loss.

Additional ancillary findings as above.

## 2015-11-24 DIAGNOSIS — H25011 Cortical age-related cataract, right eye: Secondary | ICD-10-CM | POA: Diagnosis not present

## 2015-11-24 DIAGNOSIS — H25811 Combined forms of age-related cataract, right eye: Secondary | ICD-10-CM | POA: Diagnosis not present

## 2015-11-24 DIAGNOSIS — H16229 Keratoconjunctivitis sicca, not specified as Sjogren's, unspecified eye: Secondary | ICD-10-CM | POA: Diagnosis not present

## 2015-11-24 DIAGNOSIS — H2512 Age-related nuclear cataract, left eye: Secondary | ICD-10-CM | POA: Diagnosis not present

## 2016-01-19 DIAGNOSIS — L820 Inflamed seborrheic keratosis: Secondary | ICD-10-CM | POA: Diagnosis not present

## 2016-01-19 DIAGNOSIS — D1801 Hemangioma of skin and subcutaneous tissue: Secondary | ICD-10-CM | POA: Diagnosis not present

## 2016-01-19 DIAGNOSIS — D235 Other benign neoplasm of skin of trunk: Secondary | ICD-10-CM | POA: Diagnosis not present

## 2016-01-19 DIAGNOSIS — L821 Other seborrheic keratosis: Secondary | ICD-10-CM | POA: Diagnosis not present

## 2016-01-29 DIAGNOSIS — Z23 Encounter for immunization: Secondary | ICD-10-CM | POA: Diagnosis not present

## 2016-02-08 DIAGNOSIS — Z1231 Encounter for screening mammogram for malignant neoplasm of breast: Secondary | ICD-10-CM | POA: Diagnosis not present

## 2016-02-08 DIAGNOSIS — N958 Other specified menopausal and perimenopausal disorders: Secondary | ICD-10-CM | POA: Diagnosis not present

## 2016-02-08 DIAGNOSIS — Z6821 Body mass index (BMI) 21.0-21.9, adult: Secondary | ICD-10-CM | POA: Diagnosis not present

## 2016-02-08 DIAGNOSIS — Z01419 Encounter for gynecological examination (general) (routine) without abnormal findings: Secondary | ICD-10-CM | POA: Diagnosis not present

## 2016-02-26 DIAGNOSIS — H2513 Age-related nuclear cataract, bilateral: Secondary | ICD-10-CM | POA: Diagnosis not present

## 2016-02-26 DIAGNOSIS — H2511 Age-related nuclear cataract, right eye: Secondary | ICD-10-CM | POA: Diagnosis not present

## 2016-02-26 DIAGNOSIS — H18413 Arcus senilis, bilateral: Secondary | ICD-10-CM | POA: Diagnosis not present

## 2016-04-04 DIAGNOSIS — Z1211 Encounter for screening for malignant neoplasm of colon: Secondary | ICD-10-CM | POA: Diagnosis not present

## 2016-08-06 DIAGNOSIS — J1089 Influenza due to other identified influenza virus with other manifestations: Secondary | ICD-10-CM | POA: Diagnosis not present

## 2016-08-06 DIAGNOSIS — R350 Frequency of micturition: Secondary | ICD-10-CM | POA: Diagnosis not present

## 2016-08-06 DIAGNOSIS — R198 Other specified symptoms and signs involving the digestive system and abdomen: Secondary | ICD-10-CM | POA: Diagnosis not present

## 2016-08-18 DIAGNOSIS — E0590 Thyrotoxicosis, unspecified without thyrotoxic crisis or storm: Secondary | ICD-10-CM | POA: Diagnosis not present

## 2016-09-05 DIAGNOSIS — E0590 Thyrotoxicosis, unspecified without thyrotoxic crisis or storm: Secondary | ICD-10-CM | POA: Diagnosis not present

## 2016-09-05 DIAGNOSIS — Z6820 Body mass index (BMI) 20.0-20.9, adult: Secondary | ICD-10-CM | POA: Diagnosis not present

## 2016-09-05 DIAGNOSIS — R0609 Other forms of dyspnea: Secondary | ICD-10-CM | POA: Diagnosis not present

## 2016-09-05 DIAGNOSIS — E784 Other hyperlipidemia: Secondary | ICD-10-CM | POA: Diagnosis not present

## 2016-09-11 ENCOUNTER — Observation Stay (HOSPITAL_COMMUNITY)
Admission: EM | Admit: 2016-09-11 | Discharge: 2016-09-12 | Disposition: A | Discharge status: Home / Self Care | Payer: Medicare Other | Attending: Internal Medicine | Admitting: Internal Medicine

## 2016-09-11 ENCOUNTER — Encounter (HOSPITAL_COMMUNITY): Payer: Self-pay | Admitting: Emergency Medicine

## 2016-09-11 ENCOUNTER — Emergency Department (HOSPITAL_COMMUNITY): Payer: Medicare Other

## 2016-09-11 DIAGNOSIS — R5383 Other fatigue: Secondary | ICD-10-CM | POA: Diagnosis not present

## 2016-09-11 DIAGNOSIS — E0590 Thyrotoxicosis, unspecified without thyrotoxic crisis or storm: Secondary | ICD-10-CM | POA: Insufficient documentation

## 2016-09-11 DIAGNOSIS — R0602 Shortness of breath: Secondary | ICD-10-CM | POA: Diagnosis not present

## 2016-09-11 DIAGNOSIS — R110 Nausea: Secondary | ICD-10-CM

## 2016-09-11 DIAGNOSIS — R0789 Other chest pain: Principal | ICD-10-CM | POA: Insufficient documentation

## 2016-09-11 DIAGNOSIS — E785 Hyperlipidemia, unspecified: Secondary | ICD-10-CM | POA: Diagnosis not present

## 2016-09-11 DIAGNOSIS — R0609 Other forms of dyspnea: Secondary | ICD-10-CM

## 2016-09-11 DIAGNOSIS — R079 Chest pain, unspecified: Secondary | ICD-10-CM | POA: Diagnosis not present

## 2016-09-11 DIAGNOSIS — R52 Pain, unspecified: Secondary | ICD-10-CM

## 2016-09-11 DIAGNOSIS — R072 Precordial pain: Secondary | ICD-10-CM | POA: Diagnosis present

## 2016-09-11 DIAGNOSIS — K219 Gastro-esophageal reflux disease without esophagitis: Secondary | ICD-10-CM | POA: Diagnosis present

## 2016-09-11 DIAGNOSIS — R1011 Right upper quadrant pain: Secondary | ICD-10-CM | POA: Diagnosis not present

## 2016-09-11 DIAGNOSIS — R109 Unspecified abdominal pain: Secondary | ICD-10-CM | POA: Diagnosis not present

## 2016-09-11 HISTORY — DX: Gastro-esophageal reflux disease without esophagitis: K21.9

## 2016-09-11 HISTORY — DX: Disorder of thyroid, unspecified: E07.9

## 2016-09-11 LAB — HEPATIC FUNCTION PANEL
ALBUMIN: 3.9 g/dL (ref 3.5–5.0)
ALT: 16 U/L (ref 14–54)
AST: 23 U/L (ref 15–41)
Alkaline Phosphatase: 88 U/L (ref 38–126)
BILIRUBIN DIRECT: 0.1 mg/dL (ref 0.1–0.5)
Indirect Bilirubin: 0.1 mg/dL — ABNORMAL LOW (ref 0.3–0.9)
TOTAL PROTEIN: 6.4 g/dL — AB (ref 6.5–8.1)
Total Bilirubin: 0.2 mg/dL — ABNORMAL LOW (ref 0.3–1.2)

## 2016-09-11 LAB — CBC
HCT: 36.5 % (ref 36.0–46.0)
HEMOGLOBIN: 12 g/dL (ref 12.0–15.0)
MCH: 30.3 pg (ref 26.0–34.0)
MCHC: 32.9 g/dL (ref 30.0–36.0)
MCV: 92.2 fL (ref 78.0–100.0)
PLATELETS: 154 10*3/uL (ref 150–400)
RBC: 3.96 MIL/uL (ref 3.87–5.11)
RDW: 14 % (ref 11.5–15.5)
WBC: 6 10*3/uL (ref 4.0–10.5)

## 2016-09-11 LAB — BASIC METABOLIC PANEL
ANION GAP: 7 (ref 5–15)
BUN: 20 mg/dL (ref 6–20)
CALCIUM: 9.3 mg/dL (ref 8.9–10.3)
CO2: 26 mmol/L (ref 22–32)
Chloride: 106 mmol/L (ref 101–111)
Creatinine, Ser: 1.08 mg/dL — ABNORMAL HIGH (ref 0.44–1.00)
GFR calc Af Amer: 58 mL/min — ABNORMAL LOW (ref >60)
GFR, EST NON AFRICAN AMERICAN: 50 mL/min — AB (ref >60)
GLUCOSE: 95 mg/dL (ref 65–99)
Potassium: 4.3 mmol/L (ref 3.5–5.1)
Sodium: 139 mmol/L (ref 135–145)

## 2016-09-11 LAB — TROPONIN I
Troponin I: <0.03 ng/mL (ref <0.03)
Troponin I: <0.03 ng/mL (ref <0.03)

## 2016-09-11 LAB — I-STAT TROPONIN, ED: TROPONIN I, POC: 0 ng/mL (ref 0.00–0.08)

## 2016-09-11 LAB — LIPASE, BLOOD: LIPASE: 29 U/L (ref 11–51)

## 2016-09-11 MED ORDER — ONDANSETRON HCL 4 MG/2ML IJ SOLN: 4.0000 mg | Freq: Four times a day (QID) | INTRAMUSCULAR | Status: DC | PRN

## 2016-09-11 MED ORDER — IOPAMIDOL (ISOVUE-370) INJECTION 76%
INTRAVENOUS | Status: AC
  Administered 2016-09-11: 100 mL
  Filled 2016-09-11: qty 100

## 2016-09-11 MED ORDER — IOPAMIDOL (ISOVUE-370) INJECTION 76%
INTRAVENOUS | Status: AC
  Filled 2016-09-11: qty 100

## 2016-09-11 MED ORDER — SODIUM CHLORIDE 0.9 % IV SOLN
INTRAVENOUS | Status: DC
  Administered 2016-09-11: 75 mL/h via INTRAVENOUS
  Administered 2016-09-12: 06:00:00 via INTRAVENOUS

## 2016-09-11 MED ORDER — TRAMADOL HCL 50 MG PO TABS: 50.0000 mg | ORAL_TABLET | Freq: Three times a day (TID) | ORAL | Status: DC | PRN

## 2016-09-11 MED ORDER — NITROGLYCERIN 0.4 MG SL SUBL: 0.4000 mg | SUBLINGUAL_TABLET | SUBLINGUAL | Status: DC | PRN

## 2016-09-11 MED ORDER — GI COCKTAIL ~~LOC~~: 30.0000 mL | Freq: Three times a day (TID) | ORAL | Status: DC | PRN

## 2016-09-11 MED ORDER — ACETAMINOPHEN 325 MG PO TABS
650.0000 mg | ORAL_TABLET | ORAL | Status: DC | PRN
  Administered 2016-09-12: 650 mg via ORAL
  Filled 2016-09-11: qty 2

## 2016-09-11 MED ORDER — ASPIRIN EC 325 MG PO TBEC
325.0000 mg | DELAYED_RELEASE_TABLET | Freq: Every day | ORAL | Status: DC
  Administered 2016-09-11 – 2016-09-12 (×2): 325 mg via ORAL
  Filled 2016-09-11 (×2): qty 1

## 2016-09-11 MED ORDER — SIMVASTATIN 40 MG PO TABS
40.0000 mg | ORAL_TABLET | Freq: Every day | ORAL | Status: DC
  Administered 2016-09-11: 40 mg via ORAL
  Filled 2016-09-11: qty 1

## 2016-09-11 MED ORDER — ENOXAPARIN SODIUM 40 MG/0.4ML ~~LOC~~ SOLN
40.0000 mg | SUBCUTANEOUS | Status: DC
  Administered 2016-09-11: 40 mg via SUBCUTANEOUS
  Filled 2016-09-11: qty 0.4

## 2016-09-11 MED ORDER — METHIMAZOLE 5 MG PO TABS
5.0000 mg | ORAL_TABLET | Freq: Two times a day (BID) | ORAL | Status: DC
  Administered 2016-09-11 – 2016-09-12 (×2): 5 mg via ORAL
  Filled 2016-09-11 (×3): qty 1

## 2016-09-11 MED ORDER — ONDANSETRON HCL 4 MG/2ML IJ SOLN
4.0000 mg | Freq: Once | INTRAMUSCULAR | Status: AC
  Administered 2016-09-11: 4 mg via INTRAVENOUS
  Filled 2016-09-11: qty 2

## 2016-09-11 MED ORDER — SODIUM CHLORIDE 0.9 % IV BOLUS (SEPSIS): 1000.0000 mL | Freq: Once | INTRAVENOUS | Status: DC

## 2016-09-11 MED ORDER — SODIUM CHLORIDE 0.9 % IV BOLUS (SEPSIS)
1000.0000 mL | Freq: Once | INTRAVENOUS | Status: AC
  Administered 2016-09-11: 1000 mL via INTRAVENOUS

## 2016-09-11 MED ORDER — PANTOPRAZOLE SODIUM 40 MG PO TBEC
40.0000 mg | DELAYED_RELEASE_TABLET | Freq: Every day | ORAL | Status: DC
  Administered 2016-09-11 – 2016-09-12 (×2): 40 mg via ORAL
  Filled 2016-09-11 (×2): qty 1

## 2016-09-11 NOTE — ED Notes (Signed)
Patient transported to Ultrasound 

## 2016-09-11 NOTE — ED Provider Notes (Signed)
[facility] DEPT Provider Note   CSN: [id number] Arrival date & time: 09/11/16  1109     History   Chief Complaint Chief Complaint  Patient presents with  . Chest Pain  . Shortness of Breath    HPI Heather Kirby is a 71 y.o. female who presents with chest pain. PMH significant for hyperthyroidism, GERD, hyperlipidemia. She states that in March she was diagnosed with the Flu and a UTI after testing positive at an UC. Her main symptoms at that time were fever, chills, and nausea. Afterwards she developed profound fatigue which has not improved. Her husband is at bedside and describes that the patient is normally very active and this is unusual for her. She has SOB as well with any exertion, persistent nausea and decreased appetite, and lightheadedness. She went to her PCP who tested her thyroid which was normal. They also prescribed her Phenergan which has not helped that much. She went to her PCP again and had blood work done which was normal. He recommended being seen by a cardiologist since her symptoms were persistent and was set up for an appointment with Dr. Einar Gip on 5/14. Today she had an acute onset of sharp chest pain over the left side of her chest while she was driving. It lasted for about 5 minutes. EMS was called and she received 324mg  ASA and 1 SL Nitro. She states that she has had chest pain in the past but this feels different because it is lower and more consistent with GERD because she could burp and take a Gaviscon and feel better. No fever, chills, cough, wheezing, palpitations, leg swelling, vomiting.  HPI  Past Medical History:  Diagnosis Date  . GERD (gastroesophageal reflux disease)   . Thyroid disease    hyperthyroidism    There are no active problems to display for this patient.   History reviewed. No pertinent surgical history.  OB History    No data available       Home Medications    Prior to Admission medications   Not on File    Family  History No family history on file.  Social History Social History  Substance Use Topics  . Smoking status: Never Smoker  . Smokeless tobacco: Never Used  . Alcohol use Yes     Comment: occasional wine     Allergies   Patient has no allergy information on record.   Review of Systems Review of Systems  Constitutional: Positive for activity change, appetite change and fatigue. Negative for chills and fever.  Respiratory: Positive for shortness of breath. Negative for cough and wheezing.   Cardiovascular: Positive for chest pain. Negative for palpitations and leg swelling.  Gastrointestinal: Positive for diarrhea and nausea. Negative for abdominal pain, blood in stool and vomiting.  Neurological: Positive for light-headedness. Negative for syncope.  All other systems reviewed and are negative.    Physical Exam Updated Vital Signs BP 108/73   Pulse 75   Temp 98.1 F (36.7 C) (Oral)   Resp 15   Ht 5\' 7"  (1.702 m)   Wt 59 kg   SpO2 99%   BMI 20.36 kg/m   Physical Exam  Constitutional: She is oriented to person, place, and time. She appears well-developed and well-nourished. No distress.  HENT:  Head: Normocephalic and atraumatic.  Eyes: Conjunctivae are normal. Pupils are equal, round, and reactive to light. Right eye exhibits no discharge. Left eye exhibits no discharge. No scleral icterus.  Neck: Normal range of motion.  Cardiovascular: Normal rate and regular rhythm.  Exam reveals no gallop and no friction rub.   No murmur heard. Pulmonary/Chest: Effort normal and breath sounds normal. No respiratory distress. She has no wheezes. She has no rales. She exhibits tenderness (left sided chest tenderness).  Abdominal: Soft. Bowel sounds are normal. She exhibits no distension and no mass. There is no tenderness. There is no rebound and no guarding. No hernia.  Neurological: She is alert and oriented to person, place, and time.  Skin: Skin is warm and dry.  Psychiatric: She  has a normal mood and affect. Her behavior is normal.  Nursing note and vitals reviewed.    ED Treatments / Results  Labs (all labs ordered are listed, but only abnormal results are displayed) Labs Reviewed  BASIC METABOLIC PANEL - Abnormal; Notable for the following:       Result Value   Creatinine, Ser 1.08 (*)    GFR calc non Af Amer 50 (*)    GFR calc Af Amer 58 (*)    All other components within normal limits  CBC  LIPASE, BLOOD  HEPATIC FUNCTION PANEL  I-STAT TROPOININ, ED    EKG  EKG Interpretation  Date/Time:  Sunday September 11 2016 11:15:32 EDT Ventricular Rate:  79 PR Interval:    QRS Duration: 79 QT Interval:  397 QTC Calculation: 456 R Axis:   57 Text Interpretation:  Sinus rhythm Confirmed by HAVILAND MD, JULIE ([id number]) on 09/11/2016 11:51:45 AM       Radiology Dg Chest 2 View  Result Date: 09/11/2016 CLINICAL DATA:  Patient reports onset of left sided chest pain today, reports she has had SOB and nausea X several weeks. Patient reports recently becoming dizzy when getting up too fast, reports the room feels like it is spinning. Patient reports the end of march she was diagnosed with the flu and a UTI, she received the flu shot before the diagnoses and was given medication for the UTI. EXAM: CHEST  2 VIEW COMPARISON:  08/17/2005 FINDINGS: The heart size and mediastinal contours are within normal limits. Both lungs are clear. No pleural effusion or pneumothorax. The visualized skeletal structures are unremarkable. IMPRESSION: No active cardiopulmonary disease. Electronically Signed   By: Lajean Manes M.D.   On: 09/11/2016 12:01    Procedures Procedures (including critical care time)  Medications Ordered in ED Medications  iopamidol (ISOVUE-370) 76 % injection (not administered)  nitroGLYCERIN (NITROSTAT) SL tablet 0.4 mg (not administered)  pantoprazole (PROTONIX) EC tablet 40 mg (not administered)  gi cocktail (Maalox,Lidocaine,Donnatal) (not administered)    sodium chloride 0.9 % bolus 1,000 mL (1,000 mLs Intravenous New Bag/Given 09/11/16 1242)  ondansetron (ZOFRAN) injection 4 mg (4 mg Intravenous Given 09/11/16 1242)  iopamidol (ISOVUE-370) 76 % injection (100 mLs  Contrast Given 09/11/16 1413)     Initial Impression / Assessment and Plan / ED Course  I have reviewed the triage vital signs and the nursing notes.  Pertinent labs & imaging results that were available during my care of the patient were reviewed by me and considered in my medical decision making (see chart for details).  71 year old female with chest pain. She is hypertensive at times. Otherwise vitals are normal. Labs overall unremarkable other than mild elevation of SCr (1.08). EKG is NSR and trop is 0. CXR clear. RUQ [place] remarkable for liver cyst. CTA shows mild calcification of the left main artery.  Chest pain has typical and atypical factors. It is reproducible to palpation, sharp,  and fleeting however nursing has informed me that as patient was walking to the bathroom she had worsening chest pain. Shared visit with Dr. Gilford Raid. Will admit for observation. Spoke with Erin Hearing, NP who will come to see patient.  Final Clinical Impressions(s) / ED Diagnoses   Final diagnoses:  Pain  Chest pain, unspecified type  Shortness of breath  Nausea  Other fatigue    New Prescriptions New Prescriptions   No medications on file     Recardo Evangelist, PA-C 09/11/16 1513    Isla Pence, MD 09/11/16 1540

## 2016-09-11 NOTE — ED Notes (Signed)
Patient is stable and ready to be transport to the floor at this time.  Report was called to 3W RN.  Belongings taken with the patient to the floor.   

## 2016-09-11 NOTE — ED Notes (Signed)
Patient transported to CT 

## 2016-09-11 NOTE — ED Triage Notes (Signed)
Pt in from home with c/o central cp and sob. Pt states she saw her dc on Monday for sob and it's worsened today. Pt states she was in church, felt sharp NR central cp today at rest. Denies n/v, took 324 ASA and 1 NTG. Recently had flu in March, hx of GERD, states she feels better "when she burps". Alert, VSS

## 2016-09-11 NOTE — ED Notes (Signed)
Family at bedside. RN and pharmacy tech at bedside

## 2016-09-11 NOTE — H&P (Signed)
History and Physical    Heather Kirby BUL:[id number] DOB: Oct 16, 1945 DOA: 09/11/2016   PCP: Donnajean Lopes, MD   Patient coming from/Resides with: Private residence/ Husband  Admission status: Observation/telemetry-it may be medically necessary to stay a minimum 2 midnights to rule out impending and/or unexpected changes in physiologic status that may differ from initial evaluation performed in the ER and/or at time of admission-consider reevaluation of admission status in 24 hours.   Chief Complaint: Chest pain  HPI: Heather Kirby is a 71 y.o. female with medical history significant for hyperthyroidism, dyslipidemia and GERD who presents to the hospital complaining of unexplained shortness of breath nausea postural dizziness and dyspnea on exertion ongoing for 4 weeks since influenza symptoms. Today she developed nonradiating left anterior chest pain associated with chills and no other symptoms. In addition she has been experiencing nocturnal diaphoresis without fevers. She has been evaluated by her PCP and was started on anti-medic medications as well as some over-the-counter medications for symptoms without improvement. She has an appointment in place to see a cardiologist Dr. Einar Gip on 5/14. Enzymes and EKG and ER unremarkable and patient is currently chest pain-free. In addition since flu symptoms she reports anorexia poor oral intake of both solids and liquids and has been given IV fluids in the ER. She was not orthostatic. She reports weight loss less than and pounds since onset of influenza symptoms. She has undergone all of her typical malignancy screening tests (Pap/pelvic, colonoscopy) in the past 12 months but has yet to undergo her mammogram. She denies any issues with masses or changes to her breasts.  ED Course:  Vital Signs: BP (!) 164/95   Pulse 81   Temp 98.1 F (36.7 C) (Oral)   Resp (!) 22   Ht 5\' 7"  (1.702 m)   Wt 59 kg (130 lb)   SpO2 98%   BMI 20.36 kg/m  2 view  CXR: Neg Ultrasound abdomen: Stable cyst right lobe liver o/w unremarkable CT angiography chest: No PE, mild calcification left main coronary artery, mild lower lobe bronchiectatic change bilaterally without edema or consolidation Lab data: Sodium 139, potassium 4.3, chloride 106, CO2 26, glucose 95, BUN 20, creatinine 1.08, LFTs normal, poc troponin 0.00, white count 6000 with normal differential, hemoglobin 12, platelets 154,000 Medications and treatments: NS bolus 1 L, Zofran 4 mg IV 1  Review of Systems:  In addition to the HPI above,  No Headache, changes with Vision or hearing, new weakness, tingling, numbness in any extremity, dysarthria or word finding difficulty, gait disturbance or imbalance, tremors or seizure activity No problems swallowing food or Liquids, indigestion/reflux, choking or coughing while eating, abdominal pain with or after eating No Cough, palpitations, orthopnea  No Abdominal pain, N/V, melena,hematochezia, dark tarry stools, constipation No dysuria, malodorous urine, hematuria or flank pain No new skin rashes, lesions, masses or bruises, No new joint pains, aches, swelling or redness No recent unintentional weight gain  No polyuria, polydypsia or polyphagia   Past Medical History:  Diagnosis Date  . GERD (gastroesophageal reflux disease)   . Thyroid disease    hyperthyroidism    History reviewed. No pertinent surgical history.  Social History   Social History  . Marital status: Married    Spouse name: N/A  . Number of children: N/A  . Years of education: N/A   Occupational History  . Not on file.   Social History Main Topics  . Smoking status: Never Smoker  . Smokeless tobacco: Never Used  .  Alcohol use Yes     Comment: occasional wine  . Drug use: No  . Sexual activity: Not on file   Other Topics Concern  . Not on file   Social History Narrative  . No narrative on file    Mobility: Independent Work history: Not obtained   Not  on File  Family history reviewed and not pertinent and noncontributory to current physical exam her diagnostic evaluation  Prior to Admission medications   Medication Sig Start Date End Date Taking? Authorizing Provider  aspirin-acetaminophen-caffeine (EXCEDRIN MIGRAINE) [phone] MG tablet Take 1 tablet by mouth every 6 (six) hours as needed for headache.   Yes Historical Provider, MD  diphenhydramine-acetaminophen (TYLENOL PM) 25-500 MG TABS tablet Take 1 tablet by mouth at bedtime.   Yes Historical Provider, MD  methimazole (TAPAZOLE) 5 MG tablet Take 5 mg by mouth 2 (two) times daily.   Yes Historical Provider, MD  omeprazole (PRILOSEC) 40 MG capsule Take 40 mg by mouth daily.   Yes Historical Provider, MD  Probiotic Product (ALIGN PO) Take 1 capsule by mouth daily.   Yes Historical Provider, MD  promethazine (PHENERGAN) 25 MG tablet Take 25 mg by mouth 3 (three) times daily as needed for nausea or vomiting.   Yes Historical Provider, MD  simvastatin (ZOCOR) 40 MG tablet Take 40 mg by mouth daily.   Yes Historical Provider, MD  traMADol (ULTRAM) 50 MG tablet Take 50 mg by mouth 3 (three) times daily as needed for moderate pain.   Yes Historical Provider, MD    Physical Exam: Vitals:   09/11/16 1300 09/11/16 1330 09/11/16 1404 09/11/16 1430  BP:  (!) 144/81 (!) 157/79 (!) 164/95  Pulse: 71  76 81  Resp: 12  (!) 23 (!) 22  Temp:      TempSrc:      SpO2: 100%  98% 98%  Weight:      Height:          Constitutional: NAD, calm, comfortable-Seems mildly pale Eyes: PERRL, lids and conjunctivae normal ENMT: Mucous membranes are dry. Posterior pharynx clear of any exudate or lesions. Neck: normal, supple, no masses, no thyromegaly Respiratory: clear to auscultation bilaterally, no wheezing, no crackles. Normal respiratory effort. No accessory muscle use.  Cardiovascular: Regular rate and rhythm, no murmurs / rubs / gallops. No extremity edema. 2+ pedal pulses. No carotid bruits.    Abdomen: no tenderness, no masses palpated. No hepatosplenomegaly. Bowel sounds positive.  Musculoskeletal: no clubbing / cyanosis. No joint deformity upper and lower extremities. Good ROM, no contractures. Normal muscle tone.  Skin: no rashes, lesions, ulcers. No induration Neurologic: CN 2-12 grossly intact. Sensation intact, DTR normal. Strength 5/5 x all 4 extremities.  Psychiatric: Normal judgment and insight. Alert and oriented x 3. Normal mood.    Labs on Admission: I have personally reviewed following labs and imaging studies  CBC:  Recent Labs Lab 09/11/16 1131  WBC 6.0  HGB 12.0  HCT 36.5  MCV 92.2  PLT 782   Basic Metabolic Panel:  Recent Labs Lab 09/11/16 1131  NA 139  K 4.3  CL 106  CO2 26  GLUCOSE 95  BUN 20  CREATININE 1.08*  CALCIUM 9.3   GFR: Estimated Creatinine Clearance: 44.5 mL/min (A) (by C-G formula based on SCr of 1.08 mg/dL (H)). Liver Function Tests:  Recent Labs Lab 09/11/16 1131  AST 23  ALT 16  ALKPHOS 88  BILITOT 0.2*  PROT 6.4*  ALBUMIN 3.9    Recent  Labs Lab 09/11/16 1131  LIPASE 29   No results for input(s): AMMONIA in the last 168 hours. Coagulation Profile: No results for input(s): INR, PROTIME in the last 168 hours. Cardiac Enzymes: No results for input(s): CKTOTAL, CKMB, CKMBINDEX, TROPONINI in the last 168 hours. BNP (last 3 results) No results for input(s): PROBNP in the last 8760 hours. HbA1C: No results for input(s): HGBA1C in the last 72 hours. CBG: No results for input(s): GLUCAP in the last 168 hours. Lipid Profile: No results for input(s): CHOL, HDL, LDLCALC, TRIG, CHOLHDL, LDLDIRECT in the last 72 hours. Thyroid Function Tests: No results for input(s): TSH, T4TOTAL, FREET4, T3FREE, THYROIDAB in the last 72 hours. Anemia Panel: No results for input(s): VITAMINB12, FOLATE, FERRITIN, TIBC, IRON, RETICCTPCT in the last 72 hours. Urine analysis: No results found for: COLORURINE, APPEARANCEUR, LABSPEC,  PHURINE, GLUCOSEU, HGBUR, BILIRUBINUR, KETONESUR, PROTEINUR, UROBILINOGEN, NITRITE, LEUKOCYTESUR Sepsis Labs: @LABRCNTIP (procalcitonin:4,lacticidven:4) )No results found for this or any previous visit (from the past 240 hour(s)).   Radiological Exams on Admission: Dg Chest 2 View  Result Date: 09/11/2016 CLINICAL DATA:  Patient reports onset of left sided chest pain today, reports she has had SOB and nausea X several weeks. Patient reports recently becoming dizzy when getting up too fast, reports the room feels like it is spinning. Patient reports the end of march she was diagnosed with the flu and a UTI, she received the flu shot before the diagnoses and was given medication for the UTI. EXAM: CHEST  2 VIEW COMPARISON:  08/17/2005 FINDINGS: The heart size and mediastinal contours are within normal limits. Both lungs are clear. No pleural effusion or pneumothorax. The visualized skeletal structures are unremarkable. IMPRESSION: No active cardiopulmonary disease. Electronically Signed   By: Lajean Manes M.D.   On: 09/11/2016 12:01   Ct Angio Chest Pe W And/or Wo Contrast  Result Date: 09/11/2016 CLINICAL DATA:  Shortness of breath and chest pain EXAM: CT ANGIOGRAPHY CHEST WITH CONTRAST TECHNIQUE: Multidetector CT imaging of the chest was performed using the standard protocol during bolus administration of intravenous contrast. Multiplanar CT image reconstructions and MIPs were obtained to evaluate the vascular anatomy. CONTRAST:  100 mL Isovue 370 nonionic COMPARISON:  Chest radiograph September 11, 2016. CT abdomen and pelvis February 24, 2015 FINDINGS: Cardiovascular: There is no demonstrable pulmonary embolus. There is no appreciable thoracic aortic aneurysm or dissection. Visualized great vessels appear unremarkable. Pericardium is not appreciably thickened. There is mild calcification in the left main coronary artery. Mediastinum/Nodes: Thyroid appears unremarkable. There is no appreciable thoracic  adenopathy. Lungs/Pleura: There is mild bibasilar atelectatic change. There is no edema or consolidation. There is no pleural effusion or pleural thickening. There is mild lower lobe bronchiectasis bilaterally. Upper Abdomen: There is a cyst in the right lobe of the liver measuring 3.6 x 3.0 cm, also present on prior study. Musculoskeletal: There are no blastic or lytic bone lesions. There is a hemangioma in the rightward aspect of the T7 vertebral body. Review of the MIP images confirms the above findings. IMPRESSION: No demonstrable pulmonary embolus. Mild calcification noted in the left main coronary artery. Mild lower lobe bronchiectatic change bilaterally. Mild bibasilar atelectasis. No edema or consolidation. No evident adenopathy. Electronically Signed   By: Lowella Grip III M.D.   On: 09/11/2016 14:44   US Abdomen Limited Ruq  Result Date: 09/11/2016 CLINICAL DATA:  Abdominal pain and nausea EXAM: US ABDOMEN LIMITED - RIGHT UPPER QUADRANT COMPARISON:  CT abdomen and pelvis February 24, 2015 FINDINGS: Gallbladder:  No gallstones or wall thickening visualized. There is no pericholecystic fluid. No sonographic Murphy sign noted by sonographer. Common bile duct: Diameter: 3 mm. No intrahepatic or extrahepatic biliary duct dilatation. Liver: There is a cyst in the anterior segment right lobe of the liver measuring 3.1 x 3.9 x 3.1 cm which contains a single septation. No other liver lesions are appreciable. Within normal limits in parenchymal echogenicity. IMPRESSION: Cystic area in right lobe of liver, also present on prior CT. Study otherwise unremarkable. Electronically Signed   By: Lowella Grip III M.D.   On: 09/11/2016 13:42    EKG: (Independently reviewed) sinus rhythm but sugar rate 79 bpm, QTC 456 ms, subtle J-point elevation in lateral leads otherwise nonischemic appearing EKG  Assessment/Plan Principal Problem:   Chest pain -Patient presents with a constellation of symptoms involving  exertional dyspnea, unexplained nausea, nocturnal diaphoresis and dizziness as well as new symptom of chest pain today. Symptoms began after treatment for influenza in March -Initial troponin and EKG reassuring -Heart score 4 -PCP had scheduled outpatient appointment with cardiologist/Ganji for 5/14-I have spoken with cardiologists app on call who will notify Dr. Einar Gip that patient needs to be evaluated this admission in the event he wants to proceed with an ischemic evaluation during this hospitalization -Cycle troponin -Echocardiogram -SL NTG prn -No indication at this juncture for full dose anticoagulation or initiation of beta blocker -Full dose aspirin -d/w Ganji- we suspect more than likely this represents a post viral syndrome (there is mild bronchiectasis on CT chest)  Active Problems:   Hyperthyroidism -Patient reports after onset of symptoms PCP check TSH which is an appropriate range for her -Continue Tapazole    HLD (hyperlipidemia) -Continue statin    GERD (gastroesophageal reflux disease) -Continue PPI -Unremarkable esophagram in 2016    Anorexia and nausea -Unclear etiology -Not responsive to anti-medics but has not been having any vomiting or diarrhea -? Post influenza syndrome -Continue IV fluids -Was given 1 L normal saline in the ER      DVT prophylaxis: Lovenox Code Status: Full  Family Communication: Husband and other family members at bedside Disposition Plan: Home Consults called: Cardiology/Ganji    Heather Kirby L. ANP-BC [facility] Pager [phone]   If 7PM-7AM, please contact night-coverage [email or web address] Password [email or web address]  09/11/2016, 4:06 PM

## 2016-09-12 ENCOUNTER — Observation Stay (HOSPITAL_COMMUNITY): Payer: Medicare Other

## 2016-09-12 DIAGNOSIS — R079 Chest pain, unspecified: Secondary | ICD-10-CM | POA: Diagnosis not present

## 2016-09-12 DIAGNOSIS — K219 Gastro-esophageal reflux disease without esophagitis: Secondary | ICD-10-CM

## 2016-09-12 DIAGNOSIS — E0590 Thyrotoxicosis, unspecified without thyrotoxic crisis or storm: Secondary | ICD-10-CM | POA: Diagnosis not present

## 2016-09-12 DIAGNOSIS — R0600 Dyspnea, unspecified: Secondary | ICD-10-CM | POA: Diagnosis not present

## 2016-09-12 DIAGNOSIS — R0602 Shortness of breath: Secondary | ICD-10-CM | POA: Diagnosis not present

## 2016-09-12 DIAGNOSIS — R0789 Other chest pain: Secondary | ICD-10-CM | POA: Diagnosis not present

## 2016-09-12 DIAGNOSIS — R5383 Other fatigue: Secondary | ICD-10-CM | POA: Diagnosis not present

## 2016-09-12 LAB — TROPONIN I: Troponin I: <0.03 ng/mL (ref <0.03)

## 2016-09-12 LAB — BASIC METABOLIC PANEL
ANION GAP: 5 (ref 5–15)
BUN: 18 mg/dL (ref 6–20)
CHLORIDE: 109 mmol/L (ref 101–111)
CO2: 26 mmol/L (ref 22–32)
Calcium: 8.6 mg/dL — ABNORMAL LOW (ref 8.9–10.3)
Creatinine, Ser: 1.17 mg/dL — ABNORMAL HIGH (ref 0.44–1.00)
GFR calc Af Amer: 53 mL/min — ABNORMAL LOW (ref >60)
GFR calc non Af Amer: 46 mL/min — ABNORMAL LOW (ref >60)
GLUCOSE: 105 mg/dL — AB (ref 65–99)
POTASSIUM: 4.5 mmol/L (ref 3.5–5.1)
SODIUM: 140 mmol/L (ref 135–145)

## 2016-09-12 MED ORDER — REGADENOSON 0.4 MG/5ML IV SOLN
INTRAVENOUS | Status: AC
  Administered 2016-09-12: 0.4 mg via INTRAVENOUS
  Filled 2016-09-12: qty 5

## 2016-09-12 MED ORDER — REGADENOSON 0.4 MG/5ML IV SOLN
0.4000 mg | Freq: Once | INTRAVENOUS | Status: AC
  Administered 2016-09-12: 0.4 mg via INTRAVENOUS
  Filled 2016-09-12: qty 5

## 2016-09-12 MED ORDER — TECHNETIUM TC 99M TETROFOSMIN IV KIT
30.0000 | PACK | Freq: Once | INTRAVENOUS | Status: AC | PRN
  Administered 2016-09-12: 30 via INTRAVENOUS

## 2016-09-12 MED ORDER — ASPIRIN 325 MG PO TBEC: 325.0000 mg | DELAYED_RELEASE_TABLET | Freq: Every day | ORAL | 0 refills | Status: DC

## 2016-09-12 MED ORDER — ONDANSETRON HCL 4 MG PO TABS: 4.0000 mg | ORAL_TABLET | Freq: Three times a day (TID) | ORAL | 0 refills | Status: DC | PRN

## 2016-09-12 NOTE — Care Management Obs Status (Signed)
[facility] NOTIFICATION   Patient Details  Name: DAVIS VANNATTER MRN: [id number] Date of Birth: 01-Jul-1945   Medicare Observation Status Notification Given:  Yes    Bethena Roys, RN 09/12/2016, 10:53 AM

## 2016-09-12 NOTE — Discharge Instructions (Signed)

## 2016-09-12 NOTE — Discharge Summary (Signed)
Physician Discharge Summary  Heather Kirby MGQ:[id number] DOB: [birth year] DOA: 09/11/2016  PCP: Donnajean Lopes, MD  Admit date: 09/11/2016 Discharge date: 09/12/2016   Recommendations for Outpatient Follow-Up:   1. Outpatient echo   Discharge Diagnosis:   Principal Problem:   Chest pain Active Problems:   Hyperthyroidism   GERD (gastroesophageal reflux disease)   HLD (hyperlipidemia)   Discharge disposition:  Home.   Discharge Condition: Improved.  Diet recommendation: Low sodium, heart healthy  Wound care: None.   History of Present Illness:   Heather Kirby is a 71 y.o. female with a Past Medical History significant for hypothyroidism, GERD, dyslipidemia who presents with chest pain. I reviewed the EKG and findings include normal sinus rhythm, T-wave flattening in leads V1 and V2. Cardiology to see the patient.   Hospital Course by Problem:    Chest pain -low risk stress test -outpatient echo with Dr. Einar Gip    Hyperthyroidism -Patient reports after onset of symptoms PCP check TSH which is an appropriate range for her -Continue Tapazole    HLD (hyperlipidemia) -Continue statin    GERD (gastroesophageal reflux disease) -Continue PPI -Unremarkable esophagram in 2016    Anorexia and nausea -eating well here -PRN zofran given    Medical Consultants:    cards   Discharge Exam:   Vitals:   09/12/16 0947 09/12/16 1408  BP: (!) 154/72 117/67  Pulse:  75  Resp:  16  Temp:  98.3 F (36.8 C)   Vitals:   09/12/16 0943 09/12/16 0945 09/12/16 0947 09/12/16 1408  BP: (!) 152/88 (!) 154/72 (!) 154/72 117/67  Pulse:    75  Resp:    16  Temp:    98.3 F (36.8 C)  TempSrc:    Oral  SpO2:    99%  Weight:      Height:        Gen:  NAD- chest pain free   The results of significant diagnostics from this hospitalization (including imaging, microbiology, ancillary and laboratory) are listed below for reference.     Procedures and  Diagnostic Studies:   Dg Chest 2 View  Result Date: 09/11/2016 CLINICAL DATA:  Patient reports onset of left sided chest pain today, reports she has had SOB and nausea X several weeks. Patient reports recently becoming dizzy when getting up too fast, reports the room feels like it is spinning. Patient reports the end of march she was diagnosed with the flu and a UTI, she received the flu shot before the diagnoses and was given medication for the UTI. EXAM: CHEST  2 VIEW COMPARISON:  08/17/2005 FINDINGS: The heart size and mediastinal contours are within normal limits. Both lungs are clear. No pleural effusion or pneumothorax. The visualized skeletal structures are unremarkable. IMPRESSION: No active cardiopulmonary disease. Electronically Signed   By: Lajean Manes M.D.   On: 09/11/2016 12:01   Ct Angio Chest Pe W And/or Wo Contrast  Result Date: 09/11/2016 CLINICAL DATA:  Shortness of breath and chest pain EXAM: CT ANGIOGRAPHY CHEST WITH CONTRAST TECHNIQUE: Multidetector CT imaging of the chest was performed using the standard protocol during bolus administration of intravenous contrast. Multiplanar CT image reconstructions and MIPs were obtained to evaluate the vascular anatomy. CONTRAST:  100 mL Isovue 370 nonionic COMPARISON:  Chest radiograph September 11, 2016. CT abdomen and pelvis February 24, 2015 FINDINGS: Cardiovascular: There is no demonstrable pulmonary embolus. There is no appreciable thoracic aortic aneurysm or dissection. Visualized great vessels appear unremarkable. Pericardium is not  appreciably thickened. There is mild calcification in the left main coronary artery. Mediastinum/Nodes: Thyroid appears unremarkable. There is no appreciable thoracic adenopathy. Lungs/Pleura: There is mild bibasilar atelectatic change. There is no edema or consolidation. There is no pleural effusion or pleural thickening. There is mild lower lobe bronchiectasis bilaterally. Upper Abdomen: There is a cyst in the  right lobe of the liver measuring 3.6 x 3.0 cm, also present on prior study. Musculoskeletal: There are no blastic or lytic bone lesions. There is a hemangioma in the rightward aspect of the T7 vertebral body. Review of the MIP images confirms the above findings. IMPRESSION: No demonstrable pulmonary embolus. Mild calcification noted in the left main coronary artery. Mild lower lobe bronchiectatic change bilaterally. Mild bibasilar atelectasis. No edema or consolidation. No evident adenopathy. Electronically Signed   By: Lowella Grip III M.D.   On: 09/11/2016 14:44   Nm Myocar Single W/spect W/wall Motion And Ef  Result Date: 09/12/2016 CLINICAL DATA:  Dyspnea on exertion.  No rest imaging performed. EXAM: MYOCARDIAL IMAGING WITH SPECT (PHARMACOLOGIC-STRESS) GATED LEFT VENTRICULAR WALL MOTION STUDY LEFT VENTRICULAR EJECTION FRACTION TECHNIQUE: Intravenous infusion of Lexiscan was performed under the supervision of the Cardiology staff. At peak effect of the drug, 10 mCi Tc-68m tetrofosmin was injected intravenously and standard myocardial SPECT imaging was performed. Quantitative gated imaging was also performed to evaluate left ventricular wall motion, and estimate left ventricular ejection fraction. COMPARISON:  None. FINDINGS: Perfusion: No decreased activity in the left ventricle on stress imaging to suggest reversible ischemia or infarction. Wall Motion: Normal left ventricular wall motion. No left ventricular dilation. Left Ventricular Ejection Fraction: 66 % End diastolic volume 46 ml End systolic volume 16 ml IMPRESSION: 1. No reversible ischemia or infarction. 2. Normal left ventricular wall motion. 3. Left ventricular ejection fraction 66% 4. Non invasive risk stratification*: Low *2012 Appropriate Use Criteria for Coronary Revascularization Focused Update: J Am Coll Cardiol. 9563;87(5):643-329. http://content.airportbarriers.com.aspx?articleid=1201161 These results will be called to the  ordering clinician or representative by the Radiologist Assistant, and communication documented in the PACS or zVision Dashboard. Electronically Signed   By: Julian Hy M.D.   On: 09/12/2016 11:59   US Abdomen Limited Ruq  Result Date: 09/11/2016 CLINICAL DATA:  Abdominal pain and nausea EXAM: US ABDOMEN LIMITED - RIGHT UPPER QUADRANT COMPARISON:  CT abdomen and pelvis February 24, 2015 FINDINGS: Gallbladder: No gallstones or wall thickening visualized. There is no pericholecystic fluid. No sonographic Murphy sign noted by sonographer. Common bile duct: Diameter: 3 mm. No intrahepatic or extrahepatic biliary duct dilatation. Liver: There is a cyst in the anterior segment right lobe of the liver measuring 3.1 x 3.9 x 3.1 cm which contains a single septation. No other liver lesions are appreciable. Within normal limits in parenchymal echogenicity. IMPRESSION: Cystic area in right lobe of liver, also present on prior CT. Study otherwise unremarkable. Electronically Signed   By: Lowella Grip III M.D.   On: 09/11/2016 13:42     Labs:   Basic Metabolic Panel:  Recent Labs Lab 09/11/16 1131 09/12/16 0237  NA 139 140  K 4.3 4.5  CL 106 109  CO2 26 26  GLUCOSE 95 105*  BUN 20 18  CREATININE 1.08* 1.17*  CALCIUM 9.3 8.6*   GFR Estimated Creatinine Clearance: 42.9 mL/min (A) (by C-G formula based on SCr of 1.17 mg/dL (H)). Liver Function Tests:  Recent Labs Lab 09/11/16 1131  AST 23  ALT 16  ALKPHOS 88  BILITOT 0.2*  PROT 6.4*  ALBUMIN 3.9    Recent Labs Lab 09/11/16 1131  LIPASE 29   No results for input(s): AMMONIA in the last 168 hours. Coagulation profile No results for input(s): INR, PROTIME in the last 168 hours.  CBC:  Recent Labs Lab 09/11/16 1131  WBC 6.0  HGB 12.0  HCT 36.5  MCV 92.2  PLT 154   Cardiac Enzymes:  Recent Labs Lab 09/11/16 1642 09/11/16 2046 09/12/16 0237  TROPONINI <0.03 <0.03 <0.03   BNP: Invalid input(s):  POCBNP CBG: No results for input(s): GLUCAP in the last 168 hours. D-Dimer No results for input(s): DDIMER in the last 72 hours. Hgb A1c No results for input(s): HGBA1C in the last 72 hours. Lipid Profile No results for input(s): CHOL, HDL, LDLCALC, TRIG, CHOLHDL, LDLDIRECT in the last 72 hours. Thyroid function studies No results for input(s): TSH, T4TOTAL, T3FREE, THYROIDAB in the last 72 hours.  Invalid input(s): FREET3 Anemia work up No results for input(s): VITAMINB12, FOLATE, FERRITIN, TIBC, IRON, RETICCTPCT in the last 72 hours. Microbiology No results found for this or any previous visit (from the past 240 hour(s)).   Discharge Instructions:   Discharge Instructions    Diet - low sodium heart healthy    Complete by:  As directed    Increase activity slowly    Complete by:  As directed      Allergies as of 09/12/2016   No Known Allergies     Medication List    STOP taking these medications   aspirin-acetaminophen-caffeine 250-250-65 MG tablet Commonly known as:  EXCEDRIN MIGRAINE     TAKE these medications   ALIGN PO Take 1 capsule by mouth daily.   aspirin 325 MG EC tablet Take 1 tablet (325 mg total) by mouth daily. Start taking on:  09/13/2016   diphenhydramine-acetaminophen 25-500 MG Tabs tablet Commonly known as:  TYLENOL PM Take 1 tablet by mouth at bedtime.   methimazole 5 MG tablet Commonly known as:  TAPAZOLE Take 5 mg by mouth 2 (two) times daily.   omeprazole 40 MG capsule Commonly known as:  PRILOSEC Take 40 mg by mouth daily.   ondansetron 4 MG tablet Commonly known as:  ZOFRAN Take 1 tablet (4 mg total) by mouth every 8 (eight) hours as needed for nausea or vomiting.   promethazine 25 MG tablet Commonly known as:  PHENERGAN Take 25 mg by mouth 3 (three) times daily as needed for nausea or vomiting.   simvastatin 40 MG tablet Commonly known as:  ZOCOR Take 40 mg by mouth daily.   traMADol 50 MG tablet Commonly known as:   ULTRAM Take 50 mg by mouth 3 (three) times daily as needed for moderate pain.      Follow-up Information    PATERSON,DANIEL G, MD Follow up in 1 week(s).   Specialty:  Internal Medicine Contact information: [place] [address] [phone]        Adrian Prows, MD Follow up.   Specialty:  Cardiology Why:  outpatient echo to be arranged Contact information: [place] [address] [phone]            Time coordinating discharge: 25 min  Signed:  Rickie Gange Alison Stalling   [facility] 09/12/2016, 2:30 PM

## 2016-09-12 NOTE — Consult Note (Signed)
CARDIOLOGY CONSULT NOTE  Patient ID: Heather Kirby MRN: [id number] DOB/AGE: 11-07-1945 71 y.o.  Admit date: 09/11/2016 Referring Physician: Dr. Benjaman Lobe Primary Physician:  Donnajean Lopes, MD Reason for Consultation  Chest pain  HPI: Heather Kirby  is a 71 y.o. female  With past medical history that includes hyperthyroidism, dyslipidemia, and GERD. As noted by Erin Hearing, NP and I agree with her findings, patient presented to the hospital complaining of unexplained shortness of breath, nausea, postural dizziness, and dyspnea on exertion ongoing for 4 weeks since influenza symptoms. Today, she developed nonradiating left anterior chest pain associated with chills and no other symptoms. In addition she has been experiencing nocturnal diaphoresis without fevers. She has been evaluated by her PCP and was started on anti-medic medications as well as some over-the-counter medications for symptoms without improvement. She has an appointment in place to see a cardiologist Dr. Einar Gip on 5/14. Enzymes and EKG and ER unremarkable and patient is currently chest pain-free. In addition since flu symptoms she reports anorexia poor oral intake of both solids and liquids and has been given IV fluids in the ER. She was not orthostatic. She reports weight loss less than and pounds since onset of influenza symptoms. She has undergone all of her typical malignancy screening tests (Pap/pelvic, colonoscopy) in the past 12 months but has yet to undergo her mammogram. She denies any issues with masses or changes to her breasts.   She was admitted to telemetry unit for observation. CT of the chest showed bronchiectasis and mild calcification of left main coronary. Patient continues to report shortness of breath with walking to the bathroom. She has mild epigastric region chest pain while at rest. No other complaints today.  Past Medical History:  Diagnosis Date  . GERD (gastroesophageal reflux disease)   . Thyroid  disease    hyperthyroidism     History reviewed. No pertinent surgical history.   No family history on file.   Social History: Social History   Social History  . Marital status: Married    Spouse name: N/A  . Number of children: N/A  . Years of education: N/A   Occupational History  . Not on file.   Social History Main Topics  . Smoking status: Never Smoker  . Smokeless tobacco: Never Used  . Alcohol use Yes     Comment: occasional wine  . Drug use: No  . Sexual activity: Not on file   Other Topics Concern  . Not on file   Social History Narrative  . No narrative on file     Prescriptions Prior to Admission  Medication Sig Dispense Refill Last Dose  . aspirin-acetaminophen-caffeine (EXCEDRIN MIGRAINE) 250-250-65 MG tablet Take 1 tablet by mouth every 6 (six) hours as needed for headache.   Past Month at Unknown time  . diphenhydramine-acetaminophen (TYLENOL PM) 25-500 MG TABS tablet Take 1 tablet by mouth at bedtime.   09/10/2016 at Unknown time  . methimazole (TAPAZOLE) 5 MG tablet Take 5 mg by mouth 2 (two) times daily.   09/10/2016 at Unknown time  . omeprazole (PRILOSEC) 40 MG capsule Take 40 mg by mouth daily.   09/10/2016 at Unknown time  . Probiotic Product (ALIGN PO) Take 1 capsule by mouth daily.   09/11/2016 at Unknown time  . promethazine (PHENERGAN) 25 MG tablet Take 25 mg by mouth 3 (three) times daily as needed for nausea or vomiting.   09/11/2016 at Unknown time  . simvastatin (ZOCOR) 40 MG tablet Take  40 mg by mouth daily.   09/10/2016 at Unknown time  . traMADol (ULTRAM) 50 MG tablet Take 50 mg by mouth 3 (three) times daily as needed for moderate pain.   09/11/2016 at Unknown time     ROS: General: no fevers/chills/night sweats Eyes: no blurry vision, diplopia, or amaurosis ENT: no sore throat or hearing loss Resp: shortness of breath on exertion present . No cough, wheezing, or hemoptysis CV: no edema or palpitations. Mild epigastric region chest pain  and also tenderness on left lateral chest wall. GI: no abdominal pain, vomiting, diarrhea, or constipation. Nausea present.  GU: no dysuria, frequency, or hematuria Skin: no rash Neuro: no headache, numbness, tingling, or weakness of extremities Musculoskeletal: no joint pain or swelling Heme: no bleeding, DVT, or easy bruising Endo: no polydipsia or polyuria    Physical Exam: Blood pressure 118/71, pulse 66, temperature 98.3 F (36.8 C), temperature source Oral, resp. rate 12, height 5\' 7"  (1.702 m), weight 61.6 kg (135 lb 11.2 oz), SpO2 98 %.   General appearance: alert, cooperative, appears stated age and no distress Lungs: clear to auscultation bilaterally Chest wall: no tenderness, left sided chest wall tenderness Heart: regular rate and rhythm, S1, S2 normal, no murmur, click, rub or gallop Abdomen: soft, non-tender; bowel sounds normal; no masses,  no organomegaly Extremities: extremities normal, atraumatic, no cyanosis or edema Pulses: 2+ and symmetric  Labs:   Lab Results  Component Value Date   WBC 6.0 09/11/2016   HGB 12.0 09/11/2016   HCT 36.5 09/11/2016   MCV 92.2 09/11/2016   PLT 154 09/11/2016    Recent Labs Lab 09/11/16 1131 09/12/16 0237  NA 139 140  K 4.3 4.5  CL 106 109  CO2 26 26  BUN 20 18  CREATININE 1.08* 1.17*  CALCIUM 9.3 8.6*  PROT 6.4*  --   BILITOT 0.2*  --   ALKPHOS 88  --   ALT 16  --   AST 23  --   GLUCOSE 95 105*    Lipid Panel  No results found for: CHOL, TRIG, HDL, CHOLHDL, VLDL, LDLCALC  BNP (last 3 results) No results for input(s): BNP in the last 8760 hours.  HEMOGLOBIN A1C No results found for: HGBA1C, MPG  Cardiac Panel (last 3 results)  Recent Labs  09/11/16 1642 09/11/16 2046 09/12/16 0237  TROPONINI <0.03 <0.03 <0.03    Lab Results  Component Value Date   TROPONINI <0.03 09/12/2016     TSH No results for input(s): TSH in the last 8760 hours.  EKG: 09/11/2016 Normal sinus rhythm at 79 bpm with  borderline first degree AV block, normal axis, no evidence of ischemia. Normal EKG.   Telemetry shows normal sinus rythm  Echo:    Radiology: Dg Chest 2 View  Result Date: 09/11/2016 CLINICAL DATA:  Patient reports onset of left sided chest pain today, reports she has had SOB and nausea X several weeks. Patient reports recently becoming dizzy when getting up too fast, reports the room feels like it is spinning. Patient reports the end of march she was diagnosed with the flu and a UTI, she received the flu shot before the diagnoses and was given medication for the UTI. EXAM: CHEST  2 VIEW COMPARISON:  08/17/2005 FINDINGS: The heart size and mediastinal contours are within normal limits. Both lungs are clear. No pleural effusion or pneumothorax. The visualized skeletal structures are unremarkable. IMPRESSION: No active cardiopulmonary disease. Electronically Signed   By: Dedra Skeens.D.  On: 09/11/2016 12:01   Ct Angio Chest Pe W And/or Wo Contrast  Result Date: 09/11/2016 CLINICAL DATA:  Shortness of breath and chest pain EXAM: CT ANGIOGRAPHY CHEST WITH CONTRAST TECHNIQUE: Multidetector CT imaging of the chest was performed using the standard protocol during bolus administration of intravenous contrast. Multiplanar CT image reconstructions and MIPs were obtained to evaluate the vascular anatomy. CONTRAST:  100 mL Isovue 370 nonionic COMPARISON:  Chest radiograph September 11, 2016. CT abdomen and pelvis February 24, 2015 FINDINGS: Cardiovascular: There is no demonstrable pulmonary embolus. There is no appreciable thoracic aortic aneurysm or dissection. Visualized great vessels appear unremarkable. Pericardium is not appreciably thickened. There is mild calcification in the left main coronary artery. Mediastinum/Nodes: Thyroid appears unremarkable. There is no appreciable thoracic adenopathy. Lungs/Pleura: There is mild bibasilar atelectatic change. There is no edema or consolidation. There is no pleural  effusion or pleural thickening. There is mild lower lobe bronchiectasis bilaterally. Upper Abdomen: There is a cyst in the right lobe of the liver measuring 3.6 x 3.0 cm, also present on prior study. Musculoskeletal: There are no blastic or lytic bone lesions. There is a hemangioma in the rightward aspect of the T7 vertebral body. Review of the MIP images confirms the above findings. IMPRESSION: No demonstrable pulmonary embolus. Mild calcification noted in the left main coronary artery. Mild lower lobe bronchiectatic change bilaterally. Mild bibasilar atelectasis. No edema or consolidation. No evident adenopathy. Electronically Signed   By: Lowella Grip III M.D.   On: 09/11/2016 14:44   US Abdomen Limited Ruq  Result Date: 09/11/2016 CLINICAL DATA:  Abdominal pain and nausea EXAM: US ABDOMEN LIMITED - RIGHT UPPER QUADRANT COMPARISON:  CT abdomen and pelvis February 24, 2015 FINDINGS: Gallbladder: No gallstones or wall thickening visualized. There is no pericholecystic fluid. No sonographic Murphy sign noted by sonographer. Common bile duct: Diameter: 3 mm. No intrahepatic or extrahepatic biliary duct dilatation. Liver: There is a cyst in the anterior segment right lobe of the liver measuring 3.1 x 3.9 x 3.1 cm which contains a single septation. No other liver lesions are appreciable. Within normal limits in parenchymal echogenicity. IMPRESSION: Cystic area in right lobe of liver, also present on prior CT. Study otherwise unremarkable. Electronically Signed   By: Lowella Grip III M.D.   On: 09/11/2016 13:42    Scheduled Meds: . aspirin EC  325 mg Oral Daily  . enoxaparin (LOVENOX) injection  40 mg Subcutaneous Q24H  . methimazole  5 mg Oral BID  . pantoprazole  40 mg Oral Daily  . simvastatin  40 mg Oral q1800   Continuous Infusions: . sodium chloride 75 mL/hr at 09/12/16 0610   PRN Meds:.acetaminophen, gi cocktail, nitroGLYCERIN, ondansetron (ZOFRAN) IV, traMADol  ASSESSMENT AND PLAN:   1. Atypical chest pain consider musculoskeletal etiology 2. Dyspnea on exertion 3. Fatigue likely related to post viral syndrome 4. Nausea   Rec: Patient symptoms are suggestive of atypical chest pain as she has symptoms at rest. No evidence of ischemia on EKG. Does report left wall tenderness with palpation. She is noted to have mild coronary calcification in the left main on CT scan of the chest. Will obtain lexiscan stress testing to exclude any coronary disease. If stress testing is normal, she may be discharged home from a cardiac standpoint.   Fatigue and dyspnea on exertion likely related to post-viral syndrome. Echocardiogram results pending. She has responded well to Zofran, would recommend she be sent home with prescription for anti-emetic medication for 4-5 days  at discharge.  Miquel Dunn, FNP-C 09/12/2016, 6:56 AM [facility]. [place] Office: [phone] If no answer Cell [phone]

## 2016-09-12 NOTE — Care Management Note (Signed)
Case Management Note  Patient Details  Name: Heather Kirby MRN: [id number] Date of Birth: [birth year]  Subjective/Objective:   Pt presented for Chest Pain- post stress test. PTA- pt was independent from home with husband. Plan to return home once stable.                Action/Plan: No needs identified from CM at this time.   Expected Discharge Date:                  Expected Discharge Plan:  Home/Self Care  In-House Referral:  NA  Discharge planning Services  CM Consult  Post Acute Care Choice:  NA Choice offered to:  NA  DME Arranged:  N/A DME Agency:  NA  HH Arranged:  NA HH Agency:  NA  Status of Service:  Completed, signed off  If discussed at [place] of Stay Meetings, dates discussed:    Additional Comments:  Bethena Roys, RN 09/12/2016, 11:00 AM

## 2016-09-12 NOTE — Plan of Care (Signed)
Problem: Safety: Goal: Ability to remain free from injury will improve Outcome: Progressing Pt educated to call for assistance to bathroom.

## 2016-09-19 DIAGNOSIS — R0609 Other forms of dyspnea: Secondary | ICD-10-CM | POA: Diagnosis not present

## 2016-09-19 DIAGNOSIS — Z6821 Body mass index (BMI) 21.0-21.9, adult: Secondary | ICD-10-CM | POA: Diagnosis not present

## 2016-09-19 DIAGNOSIS — Z1389 Encounter for screening for other disorder: Secondary | ICD-10-CM | POA: Diagnosis not present

## 2016-09-19 DIAGNOSIS — M545 Low back pain: Secondary | ICD-10-CM | POA: Diagnosis not present

## 2016-09-19 DIAGNOSIS — R634 Abnormal weight loss: Secondary | ICD-10-CM | POA: Diagnosis not present

## 2016-09-26 DIAGNOSIS — R0609 Other forms of dyspnea: Secondary | ICD-10-CM | POA: Diagnosis not present

## 2016-09-26 DIAGNOSIS — R0789 Other chest pain: Secondary | ICD-10-CM | POA: Diagnosis not present

## 2016-09-26 DIAGNOSIS — E7800 Pure hypercholesterolemia, unspecified: Secondary | ICD-10-CM | POA: Diagnosis not present

## 2016-09-28 DIAGNOSIS — R0602 Shortness of breath: Secondary | ICD-10-CM | POA: Diagnosis not present

## 2016-09-28 DIAGNOSIS — R0789 Other chest pain: Secondary | ICD-10-CM | POA: Diagnosis not present

## 2016-11-01 DIAGNOSIS — L821 Other seborrheic keratosis: Secondary | ICD-10-CM | POA: Diagnosis not present

## 2016-11-01 DIAGNOSIS — L820 Inflamed seborrheic keratosis: Secondary | ICD-10-CM | POA: Diagnosis not present

## 2016-11-01 DIAGNOSIS — L578 Other skin changes due to chronic exposure to nonionizing radiation: Secondary | ICD-10-CM | POA: Diagnosis not present

## 2016-11-08 DIAGNOSIS — H2511 Age-related nuclear cataract, right eye: Secondary | ICD-10-CM | POA: Diagnosis not present

## 2016-11-08 DIAGNOSIS — H18413 Arcus senilis, bilateral: Secondary | ICD-10-CM | POA: Diagnosis not present

## 2016-11-08 DIAGNOSIS — H25013 Cortical age-related cataract, bilateral: Secondary | ICD-10-CM | POA: Diagnosis not present

## 2016-11-08 DIAGNOSIS — H25043 Posterior subcapsular polar age-related cataract, bilateral: Secondary | ICD-10-CM | POA: Diagnosis not present

## 2016-11-08 DIAGNOSIS — H2513 Age-related nuclear cataract, bilateral: Secondary | ICD-10-CM | POA: Diagnosis not present

## 2016-11-10 DIAGNOSIS — N390 Urinary tract infection, site not specified: Secondary | ICD-10-CM | POA: Diagnosis not present

## 2016-11-10 DIAGNOSIS — R8299 Other abnormal findings in urine: Secondary | ICD-10-CM | POA: Diagnosis not present

## 2016-11-10 DIAGNOSIS — E784 Other hyperlipidemia: Secondary | ICD-10-CM | POA: Diagnosis not present

## 2016-11-10 DIAGNOSIS — N183 Chronic kidney disease, stage 3 (moderate): Secondary | ICD-10-CM | POA: Diagnosis not present

## 2016-11-10 DIAGNOSIS — M859 Disorder of bone density and structure, unspecified: Secondary | ICD-10-CM | POA: Diagnosis not present

## 2016-11-10 DIAGNOSIS — E0590 Thyrotoxicosis, unspecified without thyrotoxic crisis or storm: Secondary | ICD-10-CM | POA: Diagnosis not present

## 2016-11-17 DIAGNOSIS — R634 Abnormal weight loss: Secondary | ICD-10-CM | POA: Diagnosis not present

## 2016-11-17 DIAGNOSIS — Z0000 Encounter for general adult medical examination without abnormal findings: Secondary | ICD-10-CM | POA: Diagnosis not present

## 2016-11-17 DIAGNOSIS — E0590 Thyrotoxicosis, unspecified without thyrotoxic crisis or storm: Secondary | ICD-10-CM | POA: Diagnosis not present

## 2016-11-17 DIAGNOSIS — E784 Other hyperlipidemia: Secondary | ICD-10-CM | POA: Diagnosis not present

## 2016-11-17 DIAGNOSIS — G4709 Other insomnia: Secondary | ICD-10-CM | POA: Diagnosis not present

## 2016-11-17 DIAGNOSIS — M859 Disorder of bone density and structure, unspecified: Secondary | ICD-10-CM | POA: Diagnosis not present

## 2016-11-17 DIAGNOSIS — N183 Chronic kidney disease, stage 3 (moderate): Secondary | ICD-10-CM | POA: Diagnosis not present

## 2016-11-17 DIAGNOSIS — R140 Abdominal distension (gaseous): Secondary | ICD-10-CM | POA: Diagnosis not present

## 2016-11-17 DIAGNOSIS — Z6821 Body mass index (BMI) 21.0-21.9, adult: Secondary | ICD-10-CM | POA: Diagnosis not present

## 2016-11-17 DIAGNOSIS — N390 Urinary tract infection, site not specified: Secondary | ICD-10-CM | POA: Diagnosis not present

## 2016-11-17 DIAGNOSIS — K219 Gastro-esophageal reflux disease without esophagitis: Secondary | ICD-10-CM | POA: Diagnosis not present

## 2016-11-17 DIAGNOSIS — R0609 Other forms of dyspnea: Secondary | ICD-10-CM | POA: Diagnosis not present

## 2016-11-21 DIAGNOSIS — Z1212 Encounter for screening for malignant neoplasm of rectum: Secondary | ICD-10-CM | POA: Diagnosis not present

## 2017-01-02 DIAGNOSIS — H2511 Age-related nuclear cataract, right eye: Secondary | ICD-10-CM | POA: Diagnosis not present

## 2017-01-03 DIAGNOSIS — H2512 Age-related nuclear cataract, left eye: Secondary | ICD-10-CM | POA: Diagnosis not present

## 2017-01-10 DIAGNOSIS — H25011 Cortical age-related cataract, right eye: Secondary | ICD-10-CM | POA: Diagnosis not present

## 2017-01-10 DIAGNOSIS — H5211 Myopia, right eye: Secondary | ICD-10-CM | POA: Diagnosis not present

## 2017-01-10 DIAGNOSIS — H524 Presbyopia: Secondary | ICD-10-CM | POA: Diagnosis not present

## 2017-01-10 DIAGNOSIS — H16229 Keratoconjunctivitis sicca, not specified as Sjogren's, unspecified eye: Secondary | ICD-10-CM | POA: Diagnosis not present

## 2017-01-23 DIAGNOSIS — H2512 Age-related nuclear cataract, left eye: Secondary | ICD-10-CM | POA: Diagnosis not present

## 2017-01-30 DIAGNOSIS — L820 Inflamed seborrheic keratosis: Secondary | ICD-10-CM | POA: Diagnosis not present

## 2017-01-30 DIAGNOSIS — D225 Melanocytic nevi of trunk: Secondary | ICD-10-CM | POA: Diagnosis not present

## 2017-01-30 DIAGNOSIS — L814 Other melanin hyperpigmentation: Secondary | ICD-10-CM | POA: Diagnosis not present

## 2017-01-30 DIAGNOSIS — Z85828 Personal history of other malignant neoplasm of skin: Secondary | ICD-10-CM | POA: Diagnosis not present

## 2017-01-30 DIAGNOSIS — L821 Other seborrheic keratosis: Secondary | ICD-10-CM | POA: Diagnosis not present

## 2017-02-20 DIAGNOSIS — Z1231 Encounter for screening mammogram for malignant neoplasm of breast: Secondary | ICD-10-CM | POA: Diagnosis not present

## 2017-02-20 DIAGNOSIS — Z23 Encounter for immunization: Secondary | ICD-10-CM | POA: Diagnosis not present

## 2017-02-20 DIAGNOSIS — N952 Postmenopausal atrophic vaginitis: Secondary | ICD-10-CM | POA: Diagnosis not present

## 2017-02-20 DIAGNOSIS — Z780 Asymptomatic menopausal state: Secondary | ICD-10-CM | POA: Diagnosis not present

## 2017-02-20 DIAGNOSIS — Z124 Encounter for screening for malignant neoplasm of cervix: Secondary | ICD-10-CM | POA: Diagnosis not present

## 2017-02-20 DIAGNOSIS — Z6822 Body mass index (BMI) 22.0-22.9, adult: Secondary | ICD-10-CM | POA: Diagnosis not present

## 2017-03-30 DIAGNOSIS — M545 Low back pain: Secondary | ICD-10-CM | POA: Diagnosis not present

## 2017-03-30 DIAGNOSIS — K5909 Other constipation: Secondary | ICD-10-CM | POA: Diagnosis not present

## 2017-03-30 DIAGNOSIS — Z6822 Body mass index (BMI) 22.0-22.9, adult: Secondary | ICD-10-CM | POA: Diagnosis not present

## 2017-03-30 DIAGNOSIS — G4709 Other insomnia: Secondary | ICD-10-CM | POA: Diagnosis not present

## 2017-03-30 DIAGNOSIS — R140 Abdominal distension (gaseous): Secondary | ICD-10-CM | POA: Diagnosis not present

## 2017-03-30 DIAGNOSIS — E0590 Thyrotoxicosis, unspecified without thyrotoxic crisis or storm: Secondary | ICD-10-CM | POA: Diagnosis not present

## 2017-04-09 DIAGNOSIS — G4700 Insomnia, unspecified: Secondary | ICD-10-CM | POA: Diagnosis not present

## 2017-04-11 DIAGNOSIS — G4700 Insomnia, unspecified: Secondary | ICD-10-CM | POA: Diagnosis not present

## 2017-09-05 DIAGNOSIS — H16223 Keratoconjunctivitis sicca, not specified as Sjogren's, bilateral: Secondary | ICD-10-CM | POA: Diagnosis not present

## 2017-09-05 DIAGNOSIS — H18413 Arcus senilis, bilateral: Secondary | ICD-10-CM | POA: Diagnosis not present

## 2017-09-05 DIAGNOSIS — Z961 Presence of intraocular lens: Secondary | ICD-10-CM | POA: Diagnosis not present

## 2017-09-05 DIAGNOSIS — H02839 Dermatochalasis of unspecified eye, unspecified eyelid: Secondary | ICD-10-CM | POA: Diagnosis not present

## 2017-10-24 DIAGNOSIS — L820 Inflamed seborrheic keratosis: Secondary | ICD-10-CM | POA: Diagnosis not present

## 2017-11-14 DIAGNOSIS — E7849 Other hyperlipidemia: Secondary | ICD-10-CM | POA: Diagnosis not present

## 2017-11-14 DIAGNOSIS — M859 Disorder of bone density and structure, unspecified: Secondary | ICD-10-CM | POA: Diagnosis not present

## 2017-11-14 DIAGNOSIS — R82998 Other abnormal findings in urine: Secondary | ICD-10-CM | POA: Diagnosis not present

## 2017-11-14 DIAGNOSIS — N183 Chronic kidney disease, stage 3 (moderate): Secondary | ICD-10-CM | POA: Diagnosis not present

## 2017-11-14 DIAGNOSIS — E0590 Thyrotoxicosis, unspecified without thyrotoxic crisis or storm: Secondary | ICD-10-CM | POA: Diagnosis not present

## 2017-11-21 DIAGNOSIS — M545 Low back pain: Secondary | ICD-10-CM | POA: Diagnosis not present

## 2017-11-21 DIAGNOSIS — N390 Urinary tract infection, site not specified: Secondary | ICD-10-CM | POA: Diagnosis not present

## 2017-11-21 DIAGNOSIS — E0590 Thyrotoxicosis, unspecified without thyrotoxic crisis or storm: Secondary | ICD-10-CM | POA: Diagnosis not present

## 2017-11-21 DIAGNOSIS — Z6822 Body mass index (BMI) 22.0-22.9, adult: Secondary | ICD-10-CM | POA: Diagnosis not present

## 2017-11-21 DIAGNOSIS — N183 Chronic kidney disease, stage 3 (moderate): Secondary | ICD-10-CM | POA: Diagnosis not present

## 2017-11-21 DIAGNOSIS — K5909 Other constipation: Secondary | ICD-10-CM | POA: Diagnosis not present

## 2017-11-21 DIAGNOSIS — Z0000 Encounter for general adult medical examination without abnormal findings: Secondary | ICD-10-CM | POA: Diagnosis not present

## 2017-11-21 DIAGNOSIS — Z1389 Encounter for screening for other disorder: Secondary | ICD-10-CM | POA: Diagnosis not present

## 2017-11-21 DIAGNOSIS — M859 Disorder of bone density and structure, unspecified: Secondary | ICD-10-CM | POA: Diagnosis not present

## 2017-11-21 DIAGNOSIS — G4709 Other insomnia: Secondary | ICD-10-CM | POA: Diagnosis not present

## 2017-11-21 DIAGNOSIS — E7849 Other hyperlipidemia: Secondary | ICD-10-CM | POA: Diagnosis not present

## 2017-11-24 DIAGNOSIS — Z1212 Encounter for screening for malignant neoplasm of rectum: Secondary | ICD-10-CM | POA: Diagnosis not present

## 2017-12-22 DIAGNOSIS — L814 Other melanin hyperpigmentation: Secondary | ICD-10-CM | POA: Diagnosis not present

## 2017-12-22 DIAGNOSIS — L578 Other skin changes due to chronic exposure to nonionizing radiation: Secondary | ICD-10-CM | POA: Diagnosis not present

## 2017-12-22 DIAGNOSIS — L821 Other seborrheic keratosis: Secondary | ICD-10-CM | POA: Diagnosis not present

## 2018-01-18 ENCOUNTER — Institutional Professional Consult (permissible substitution): Payer: Self-pay | Admitting: Neurology

## 2018-02-07 ENCOUNTER — Encounter: Payer: Self-pay | Admitting: Neurology

## 2018-02-19 ENCOUNTER — Ambulatory Visit (INDEPENDENT_AMBULATORY_CARE_PROVIDER_SITE_OTHER): Payer: PPO | Admitting: Neurology

## 2018-02-19 ENCOUNTER — Encounter: Payer: Self-pay | Admitting: Neurology

## 2018-02-19 VITALS — BP 131/80 | HR 79 | Ht 67.0 in | Wt 141.5 lb

## 2018-02-19 DIAGNOSIS — E0590 Thyrotoxicosis, unspecified without thyrotoxic crisis or storm: Secondary | ICD-10-CM

## 2018-02-19 DIAGNOSIS — K219 Gastro-esophageal reflux disease without esophagitis: Secondary | ICD-10-CM

## 2018-02-19 DIAGNOSIS — G4709 Other insomnia: Secondary | ICD-10-CM | POA: Diagnosis not present

## 2018-02-19 DIAGNOSIS — R0683 Snoring: Secondary | ICD-10-CM

## 2018-02-19 DIAGNOSIS — Q674 Other congenital deformities of skull, face and jaw: Secondary | ICD-10-CM

## 2018-02-19 MED ORDER — TRAZODONE HCL 50 MG PO TABS: 25.0000 mg | ORAL_TABLET | Freq: Every evening | ORAL | 1 refills | Status: DC | PRN

## 2018-02-19 NOTE — Progress Notes (Signed)
SLEEP MEDICINE CLINIC   Provider:  Larey Seat, M D  Primary Care Physician:  Leanna Battles, MD   Referring Provider: Leanna Battles, MD    Chief Complaint  Patient presents with  . New Patient (Initial Visit)    Room 10. Pt is alone. She is hearing impaired. She suffered from hyperthyroidism.     HPI:  Heather Kirby is a 72 y.o. female patient  seen here on 02-19-2018 as in a referral  from Dr. Philip Aspen for a sleep evaluation.   Chief complaint according to patient : Heather Kirby reports having trouble sleeping since she got dental implants, over the last 12-18 month. She bites the inside of the buccal tissue. She has a very dry mouth and wakes up frequently. She underwent an ONO, but the device failed ( 04-10-2017). She feels the oral anatomical changes let to her not sleeping well. She remains with a cross bite, she stated. She has severe nasal septal deviation and snores loudly, especially in supine sleep.     Sleep habits are as follows: takes Protonix at 4 PM - The patient usually has a dinnertime around 6 PM, she may have taken an afternoon nap lasting from 30 to 45 minutes in duration.  The patient and her husband go to bed rather early but watch TV in the bedroom.  She takes 2 Tylenol PMs every night- This may be around 9 PM, the TV will be switched off by an hour later ( 10.30) , but Mr. Lynk apparently starts to watch TV again if he wakes up during the night. Heather. Solinger somewhat protected through her hearing deficit may be able to sleep through.  She has one bathroom break at 2.30 AM- she sleeps on 1-2 pillows, and sleeps on her right side, but she wakes on her back. She rarely recalls dreams, but she has ruminating thoughts, "can't switch her brain off " . She often coughs at night, non productive.  The couple rises at 7 AM, wakes spontaneously.  She feels hurt, stiff and starts the day with a tramadol, ASA and calcium. She feels refreshed.   Sleep medical history  and family sleep history:     Social history: married- travelling extensively- lives with husband. No pets. One adult son, one grandson. Non smoker, social drinker- wine- [place]ies[place]. Caffeine - 2 coffees, no tea nor soda.    Review of Systems: Out of a complete 14 system review, the patient complains of only the following symptoms, and all other reviewed systems are negative.  hearing aids, snoring.  Arthritis. DDD- back pain, shoulder pain.   Epworth score 7/ 24  , Fatigue severity score 28/ 63 points  , depression score negative   Social History   Socioeconomic History  . Marital status: Married    Spouse name: Not on file  . Number of children: Not on file  . Years of education: Not on file  . Highest education level: Not on file  Occupational History  . Not on file  Social Needs  . Financial resource strain: Not on file  . Food insecurity:    Worry: Not on file    Inability: Not on file  . Transportation needs:    Medical: Not on file    Non-medical: Not on file  Tobacco Use  . Smoking status: Never Smoker  . Smokeless tobacco: Never Used  Substance and Sexual Activity  . Alcohol use: Yes    Comment: occasional wine  . Drug use:  No  . Sexual activity: Not on file  Lifestyle  . Physical activity:    Days per week: Not on file    Minutes per session: Not on file  . Stress: Not on file  Relationships  . Social connections:    Talks on phone: Not on file    Gets together: Not on file    Attends religious service: Not on file    Active member of club or organization: Not on file    Attends meetings of clubs or organizations: Not on file    Relationship status: Not on file  . Intimate partner violence:    Fear of current or ex partner: Not on file    Emotionally abused: Not on file    Physically abused: Not on file    Forced sexual activity: Not on file  Other Topics Concern  . Not on file  Social History Narrative  . Not on file    Family History    Problem Relation Age of Onset  . Heart failure Mother     Past Medical History:  Diagnosis Date  . GERD (gastroesophageal reflux disease)   . Thrombocytopenia ([place])   . Thyroid disease    hyperthyroidism    Past Surgical History:  Procedure Laterality Date  . CATARACT EXTRACTION W/ INTRAOCULAR LENS  IMPLANT, BILATERAL     right 8/18 and left was 9/18  . COLONOSCOPY    . SHOULDER ARTHROSCOPY Right     Current Outpatient Medications  Medication Sig Dispense Refill  . aspirin EC 81 MG tablet Take 81 mg by mouth daily.    . diphenhydramine-acetaminophen (TYLENOL PM) 25-500 MG TABS tablet Take 1 tablet by mouth at bedtime.    . methimazole (TAPAZOLE) 5 MG tablet Take 5 mg by mouth 2 (two) times daily.    Marland Kitchen omeprazole (PRILOSEC) 20 MG capsule Take 20 mg by mouth daily.    . ondansetron (ZOFRAN) 4 MG tablet Take 1 tablet (4 mg total) by mouth every 8 (eight) hours as needed for nausea or vomiting. 20 tablet 0  . simvastatin (ZOCOR) 40 MG tablet Take 40 mg by mouth daily.    . traMADol (ULTRAM) 50 MG tablet Take 50 mg by mouth 3 (three) times daily as needed for moderate pain.     No current facility-administered medications for this visit.     Allergies as of 02/19/2018  . (No Known Allergies)    Vitals: BP 131/80   Pulse 79   Ht 5\' 7"  (1.702 m)   Wt 141 lb 8 oz (64.2 kg)   BMI 22.16 kg/m  Last Weight:  Wt Readings from Last 1 Encounters:  02/19/18 141 lb 8 oz (64.2 kg)   OVF:IEPP mass index is 22.16 kg/m.     Last Height:   Ht Readings from Last 1 Encounters:  02/19/18 5\' 7"  (1.702 m)    Physical exam:  General: The patient is awake, alert and appears not in acute distress. The patient is well groomed. Head: Normocephalic, atraumatic. Neck is supple. Mallampati 1,  neck circumference:14". Nasal airflow congested - septal deviation- mouth breather sinusitis. , TMJ click evident on the right.  Retrognathia is seen.  Cardiovascular:  Regular rate and rhythm,  without  murmurs or carotid bruit, and without distended neck veins. Respiratory: Lungs are clear to auscultation. Skin:  Without evidence of edema, or rash Trunk: BMI is 22.16. The patient's posture is erect.  Neurologic exam : The patient is awake and alert, oriented  to place and time.   Memory subjective described as intact.  Attention span & concentration ability appears normal.  Speech is fluent,  without  dysarthria, dysphonia or aphasia.  Mood and affect are appropriate.  Cranial nerves: Pupils are equal and briskly reactive to light.  Funduscopic exam without evidence of pallor or edema.  Extraocular movements  in vertical and horizontal planes intact and without nystagmus. Visual fields by finger perimetry are intact. Hearing impaired.   Facial sensation intact to fine touch. Facial motor strength is symmetric and tongue and uvula move midline. Shoulder shrug was symmetrical.   Motor exam:   Normal tone, muscle bulk and symmetric strength in all extremities. Sensory:  Fine touch, pinprick and vibration were intact.  Proprioception tested in the upper extremities was normal. Coordination: Rapid alternating movements in the fingers/hands was normal. No change in penmanship.   Finger-to-nose maneuver  normal without evidence of ataxia, dysmetria or tremor.  Gait and station: Patient walks without assistive device and is able unassisted to climb up to the exam table. Strength within normal limits.  Deep tendon reflexes: in the upper and lower extremities are brisk- symmetric and intact.   Assessment:  After physical and neurologic examination, review of laboratory studies,  Personal review of imaging studies, reports of other /same  Imaging studies, results of polysomnography and / or neurophysiology testing and pre-existing records as far as provided in visit., my assessment is   1) Heather. Erisman has been a mouth breather for much of her adult life, she has recurrent sinus problems,  postnasal drip and a long-standing typically congenital nasal septal deviation.  In addition she had dental surgery and the size and shape of the implants did not feel natural from the very beginning, she began chewing the inside of her cheek, she could not rest on that side of her face.  It has an impact on her sleep as well.  She wakes up with a dry mouth probably because she has to breathe through the mouth but she also has been snoring at times.  She has a long-standing history of having trouble to go to sleep and she has taken 2 Tylenol PM for long time.  My concern is that the Benadryl component is likely contributing to her dry mouth, and to the retracting gingiva and may have affected her dental health.  She has never tried a prescription sleep aid.  The Tylenol also helps her with her arthritis pain and stiffness.  She is not excessively sleepy or fatigued and usually feels well rested when first waking up in the morning aside from joints being stiff.  But I would like to do is to evaluate and screen her for sleep apnea, but I would also be happy to prescribe trazodone as a sleep aid, which should actually not interfere with saliva production and keep her mouth more moist.   I also addressed sleep hygiene- she needs to get the TV out of the bedroom- not for sounds, but lights.  Same for her blue light display alarm clock.   The patient was advised of the nature of the diagnosed disorder , the treatment options and the  risks for general health and wellness arising from not treating the condition.   I spent more than 45 minutes of face to face time with the patient.  Greater than 50% of time was spent in counseling and coordination of care. We have discussed the diagnosis and differential and I answered the patient's questions.  Larey Seat, MD 84/0/3754, 36:06 AM  Certified in Neurology by ABPN Certified in [place] by [facility] [address],  [place], [place] [address]

## 2018-03-01 DIAGNOSIS — Z1231 Encounter for screening mammogram for malignant neoplasm of breast: Secondary | ICD-10-CM | POA: Diagnosis not present

## 2018-03-01 DIAGNOSIS — Z124 Encounter for screening for malignant neoplasm of cervix: Secondary | ICD-10-CM | POA: Diagnosis not present

## 2018-03-01 DIAGNOSIS — Z779 Other contact with and (suspected) exposures hazardous to health: Secondary | ICD-10-CM | POA: Diagnosis not present

## 2018-03-01 DIAGNOSIS — Z6822 Body mass index (BMI) 22.0-22.9, adult: Secondary | ICD-10-CM | POA: Diagnosis not present

## 2018-03-14 ENCOUNTER — Ambulatory Visit (INDEPENDENT_AMBULATORY_CARE_PROVIDER_SITE_OTHER): Payer: PPO | Admitting: Neurology

## 2018-03-14 DIAGNOSIS — K219 Gastro-esophageal reflux disease without esophagitis: Secondary | ICD-10-CM

## 2018-03-14 DIAGNOSIS — K1379 Other lesions of oral mucosa: Secondary | ICD-10-CM

## 2018-03-14 DIAGNOSIS — G4733 Obstructive sleep apnea (adult) (pediatric): Secondary | ICD-10-CM | POA: Diagnosis not present

## 2018-03-14 DIAGNOSIS — G4709 Other insomnia: Secondary | ICD-10-CM

## 2018-03-14 DIAGNOSIS — M2624 Reverse articulation: Secondary | ICD-10-CM

## 2018-03-14 DIAGNOSIS — R0683 Snoring: Secondary | ICD-10-CM

## 2018-03-14 DIAGNOSIS — Q674 Other congenital deformities of skull, face and jaw: Secondary | ICD-10-CM

## 2018-03-14 DIAGNOSIS — E0590 Thyrotoxicosis, unspecified without thyrotoxic crisis or storm: Secondary | ICD-10-CM

## 2018-03-26 ENCOUNTER — Telehealth: Payer: Self-pay

## 2018-03-26 ENCOUNTER — Telehealth: Payer: Self-pay | Admitting: Neurology

## 2018-03-26 DIAGNOSIS — G4709 Other insomnia: Secondary | ICD-10-CM

## 2018-03-26 DIAGNOSIS — R0683 Snoring: Secondary | ICD-10-CM

## 2018-03-26 DIAGNOSIS — Q674 Other congenital deformities of skull, face and jaw: Secondary | ICD-10-CM

## 2018-03-26 DIAGNOSIS — G4733 Obstructive sleep apnea (adult) (pediatric): Secondary | ICD-10-CM

## 2018-03-26 DIAGNOSIS — K219 Gastro-esophageal reflux disease without esophagitis: Secondary | ICD-10-CM

## 2018-03-26 DIAGNOSIS — M2624 Reverse articulation: Secondary | ICD-10-CM

## 2018-03-26 NOTE — Addendum Note (Signed)
Addended by: Larey Seat on: 03/26/2018 08:20 AM   Modules accepted: Orders

## 2018-03-26 NOTE — Telephone Encounter (Signed)
-----   Message from Larey Seat, MD sent at 03/26/2018  8:20 AM EST ----- Surprisingly severe sleep apnea result by Gweneth Dimitri - This result is reliable. She has a choice of CPAP attended titration with a comfortable interface to be fitted or autotitration- I prefer her coming to the lab because she has nasal patency problems, is slender and has crossbite- her mask need to be truly fitted for her.   Primary Physician: Please involve ENT to achieve better nasal patency.

## 2018-03-26 NOTE — Procedures (Signed)
NAME: Heather Kirby                                                               DOB: 1946/02/27 MEDICAL RECORD [id number]                                                  DOS:  03/14/2018 REFERRING PHYSICIAN: Leanna Battles, MD STUDY PERFORMED: Home Sleep Study on watch pat  HISTORY: Heather Kirby is a 72 y.o. female patient and was seen on 02-19-2018 in a referral from Dr. Philip Aspen for a sleep evaluation. Chief complaint according to patient: Heather Kirby reports having trouble sleeping since she got dental implants, over the last 12-18 month. She bites the inside of her buccal tissue. She has a very dry mouth and wakes up frequently. She was to undergo an ONO, but reportedly the device failed (04-10-2017). She feels the oral anatomical changes let to her not sleeping well. She remains with a cross bite, she stated. She has severe nasal septal deviation and snores loudly, especially in supine sleep.  Epworth score endorsed at 7/ 24 points, Fatigue severity score at 28/ 63 points, BMI: 22.1  STUDY RESULTS:  Total Recording Time: 8 hours 18 minutes: valid 7 h, 7 min. Total Apnea/Hypopnea Index (AHI):  30.6 /h:  RDI:  30.8 /h; ODI 10.7/h Average Oxygen Saturation: 94 %; Lowest Oxygen Desaturation: 88 %  Total Time in Oxygen Saturation below 89 %: 0.0 minutes  Average Heart Rate:  66 bpm (between 57 and 81 bpm).  IMPRESSION: Severe obstructive sleep apnea, with REM accentuation to AHI 37.8/h.  RECOMMENDATION: A dental device does not influence REM dependent Apnea well, but may not be helpful in a patient already struggling with dental issues.  I recommend an attended titration to CPAP, and ENT referral for nasal patency issues.   I certify that I have reviewed the raw data recording prior to the issuance of this report in accordance with the standards of the American Academy of Sleep Medicine (AASM). Larey Seat, M.D.   03-26-2018    Medical Director of [facility], accredited by  the AASM. Diplomat of the ABPN and ABSM.

## 2018-03-26 NOTE — Telephone Encounter (Signed)
I called pt. I advised pt that Dr. Brett Fairy reviewed their sleep study results and found that pt has severe sleep apnea. Dr. Brett Fairy recommends that pt starts auto CPAP. I reviewed PAP compliance expectations with the pt. Pt is agreeable to starting a CPAP. I advised pt that an order will be sent to a DME, [facility], and [facility] will call the pt within about one week after they file with the pt's insurance. [facility] will show the pt how to use the machine, fit for masks, and troubleshoot the CPAP if needed. A follow up appt was made for insurance purposes with Ward Givens, NP on Feb 6,2020. Pt verbalized understanding to arrive 15 minutes early and bring their CPAP. A letter with all of this information in it will be mailed to the pt as a reminder. I verified with the pt that the address we have on file is correct. Pt verbalized understanding of results. Pt had no questions at this time but was encouraged to call back if questions arise. I have sent the order to [employer] and have received confirmation that they have received the order.

## 2018-03-26 NOTE — Telephone Encounter (Signed)
[employer] will deny cpap titration. Does not meet criteria. Needs an auto unit ordered. Dental device will not completely alleviate apnea with AHI above 30.

## 2018-05-02 ENCOUNTER — Telehealth: Payer: Self-pay

## 2018-05-02 DIAGNOSIS — J342 Deviated nasal septum: Secondary | ICD-10-CM | POA: Diagnosis not present

## 2018-05-02 DIAGNOSIS — G4733 Obstructive sleep apnea (adult) (pediatric): Secondary | ICD-10-CM | POA: Diagnosis not present

## 2018-05-02 DIAGNOSIS — J343 Hypertrophy of nasal turbinates: Secondary | ICD-10-CM | POA: Insufficient documentation

## 2018-05-02 NOTE — Telephone Encounter (Signed)
Order is already on file with [facility]. I have sent a message informing that patient is ready to start again and noted that pt is scheduled already for initial cpap follow up for feb 6th with hopes they can get her set up in time.

## 2018-05-02 NOTE — Telephone Encounter (Signed)
Patient came by sleep lab to let us know her ENT doctor wants her to try cpap first before he will do surgery. Can you send order to DME company again?

## 2018-05-14 DIAGNOSIS — G4733 Obstructive sleep apnea (adult) (pediatric): Secondary | ICD-10-CM | POA: Diagnosis not present

## 2018-06-05 DIAGNOSIS — G4733 Obstructive sleep apnea (adult) (pediatric): Secondary | ICD-10-CM | POA: Diagnosis not present

## 2018-06-21 ENCOUNTER — Encounter: Payer: Self-pay | Admitting: Adult Health

## 2018-06-21 ENCOUNTER — Other Ambulatory Visit: Payer: Self-pay

## 2018-06-21 ENCOUNTER — Ambulatory Visit: Payer: PPO | Admitting: Adult Health

## 2018-06-21 ENCOUNTER — Telehealth: Payer: Self-pay | Admitting: Neurology

## 2018-06-21 DIAGNOSIS — G4733 Obstructive sleep apnea (adult) (pediatric): Secondary | ICD-10-CM | POA: Insufficient documentation

## 2018-06-21 DIAGNOSIS — Z9989 Dependence on other enabling machines and devices: Secondary | ICD-10-CM

## 2018-06-21 NOTE — Telephone Encounter (Signed)
Spoke to NP about 30 day CPAP download, auto cpap with unusual high leak data but only 4 sec per hour in high leak- it is likely that the patient removed the mask before she turns off the machine. DME asked to restrict window.    Larey Seat, MD

## 2018-06-21 NOTE — Telephone Encounter (Signed)
Received note from Sallee Lange, with [employer] that received new cpap orders.

## 2018-06-21 NOTE — Progress Notes (Signed)
PATIENT: Heather Kirby DOB: 07-Dec-1945  REASON FOR VISIT: follow up HISTORY FROM: patient  HISTORY OF PRESENT ILLNESS: Today 06/21/18  Heather Kirby is a 73 year old female who presents for follow-up obstructive sleep apnea with CPAP use.  Heather Kirby has been using her CPAP machine for just over 30 days.  She is working with [facility].  Her compliance download reveals 100% compliance over the last 30 days.  She had an average usage of 7 hours and 5 minutes.  She had 100% compliance using her machine for greater than 4 hours.  She had an average time in large leak per day of 4 seconds.  Her average AHI was 5.2.  Her minimum pressure is set at 4 cmH20/20cmH20.  She reports that she feels like her mask is leaking around her nose.  She is also working with [facility] to provide  small pillows for her tubing.  She currently purchased one herself from [place] that was size medium.  She reports that when she started using her machine she felt very anxious but now she has adjusted to it and she is feeling much better about it.  She reports she is sleeping well at night with the machine.  She does report that last night her machine was blowing cold air and that ran out of water.  She mentions that she plans to talk with the [facility] technician about adjusting the humidifier.  She presents today for follow-up.  HISTORY [name] is a 72 y.o. female patient  seen here on 02-19-2018 as in a referral  from Dr. Philip Kirby for a sleep evaluation.  Chief complaint according to patient : Heather Kirby reports having trouble sleeping since she got dental implants, over the last 12-18 month. She bites the inside of the buccal tissue. She has a very dry mouth and wakes up frequently. She underwent an ONO, but the device failed ( 04-10-2017). She feels the oral anatomical changes let to her not sleeping well. She remains with a cross bite, she stated. She has severe nasal septal deviation and snores loudly, especially in  supine sleep.    Sleep habits are as follows: takes Protonix at 4 PM - The patient usually has a dinnertime around 6 PM, she may have taken an afternoon nap lasting from 30 to 45 minutes in duration.  The patient and her husband go to bed rather early but watch TV in the bedroom.  She takes 2 Tylenol PMs every night- This may be around 9 PM, the TV will be switched off by an hour later ( 10.30) , but Heather Kirby to watch TV again if he wakes up during the night. Heather Kirby somewhat protected through her hearing deficit may be able to sleep through.  She has one bathroom break at 2.30 AM- she sleeps on 1-2 pillows, and sleeps on her right side, but she wakes on her back. She rarely recalls dreams, but she has ruminating thoughts, "can't switch her brain off " . She often coughs at night, non productive.  The couple rises at 7 AM, wakes spontaneously.  She feels hurt, stiff and Kirby the day with a tramadol, ASA and calcium. She feels refreshed.   Sleep medical history and family sleep history:     Social history: married- travelling extensively- lives with husband. No pets. One adult son, one grandson. Non smoker, social drinker- wine- [place]. Caffeine - 2 coffees, no tea nor soda.    REVIEW  OF SYSTEMS: Out of a complete 14 system review of symptoms, the patient complains only of the following symptoms, and all other reviewed systems are negative.  Apnea, snoring, aching muscles  ALLERGIES: No Known Allergies  HOME MEDICATIONS: Outpatient Medications Prior to Visit  Medication Sig Dispense Refill  . aspirin EC 81 MG tablet Take 81 mg by mouth daily.    . diphenhydramine-acetaminophen (TYLENOL PM) 25-500 MG TABS tablet Take 1 tablet by mouth at bedtime.    . methimazole (TAPAZOLE) 5 MG tablet Take 5 mg by mouth 2 (two) times daily.    Marland Kitchen omeprazole (PRILOSEC) 20 MG capsule Take 20 mg by mouth daily.    . ondansetron (ZOFRAN) 4 MG tablet Take 1 tablet (4 mg total) by  mouth every 8 (eight) hours as needed for nausea or vomiting. 20 tablet 0  . simvastatin (ZOCOR) 40 MG tablet Take 40 mg by mouth daily.    . traMADol (ULTRAM) 50 MG tablet Take 50 mg by mouth 3 (three) times daily as needed for moderate pain.    . traZODone (DESYREL) 50 MG tablet Take 0.5-1 tablets (25-50 mg total) by mouth at bedtime as needed for sleep. (Patient not taking: Reported on 06/21/2018) 45 tablet 1   No facility-administered medications prior to visit.     PAST MEDICAL HISTORY: Past Medical History:  Diagnosis Date  . GERD (gastroesophageal reflux disease)   . Thrombocytopenia ([place])   . Thyroid disease    hyperthyroidism    PAST SURGICAL HISTORY: Past Surgical History:  Procedure Laterality Date  . CATARACT EXTRACTION W/ INTRAOCULAR LENS  IMPLANT, BILATERAL     right 8/18 and left was 9/18  . COLONOSCOPY    . SHOULDER ARTHROSCOPY Right     FAMILY HISTORY: Family History  Problem Relation Age of Onset  . Heart failure Mother     SOCIAL HISTORY: Social History   Socioeconomic History  . Marital status: Married    Spouse name: Not on file  . Number of children: Not on file  . Years of education: Not on file  . Highest education level: Not on file  Occupational History  . Not on file  Social Needs  . Financial resource strain: Not on file  . Food insecurity:    Worry: Not on file    Inability: Not on file  . Transportation needs:    Medical: Not on file    Non-medical: Not on file  Tobacco Use  . Smoking status: Never Smoker  . Smokeless tobacco: Never Used  Substance and Sexual Activity  . Alcohol use: Yes    Comment: occasional wine  . Drug use: No  . Sexual activity: Not on file  Lifestyle  . Physical activity:    Days per week: Not on file    Minutes per session: Not on file  . Stress: Not on file  Relationships  . Social connections:    Talks on phone: Not on file    Gets together: Not on file    Attends religious service: Not on file      Active member of club or organization: Not on file    Attends meetings of clubs or organizations: Not on file    Relationship status: Not on file  . Intimate partner violence:    Fear of current or ex partner: Not on file    Emotionally abused: Not on file    Physically abused: Not on file    Forced sexual activity: Not on file  Other Topics Concern  . Not on file  Social History Narrative  . Not on file      PHYSICAL EXAM  Vitals:   06/21/18 0927  BP: (!) 112/56  Pulse: 68  Resp: 18  Weight: 139 lb 12.8 oz (63.4 kg)  Height: 5\' 7"  (1.702 m)   Body mass index is 21.9 kg/m.  Generalized: Well developed, in no acute distress  Neck circumference is 12.  [place] 2 Neurological examination  Mentation: Alert oriented to time, place, history taking. Follows all commands speech and language fluent Cranial nerve II-XII: Pupils were equal round reactive to light. Extraocular movements were full, visual field were full on confrontational test. Facial sensation and strength were normal. Uvula tongue midline. Head turning and shoulder shrug  were normal and symmetric. Motor: The motor testing reveals 5 over 5 strength of all 4 extremities. Good symmetric motor tone is noted throughout.  Sensory: Sensory testing is intact to soft touch on all 4 extremities. No evidence of extinction is noted.  Coordination: Cerebellar testing reveals good finger-nose-finger and heel-to-shin bilaterally.  Gait and station: Gait is normal..  Reflexes: Deep tendon reflexes are symmetric and normal bilaterally.   DIAGNOSTIC DATA (LABS, IMAGING, TESTING) - I reviewed patient records, labs, notes, testing and imaging myself where available.  Lab Results  Component Value Date   WBC 6.0 09/11/2016   HGB 12.0 09/11/2016   HCT 36.5 09/11/2016   MCV 92.2 09/11/2016   PLT 154 09/11/2016      Component Value Date/Time   NA 140 09/12/2016 0237   K 4.5 09/12/2016 0237   CL 109 09/12/2016 0237   CO2  26 09/12/2016 0237   GLUCOSE 105 (H) 09/12/2016 0237   BUN 18 09/12/2016 0237   CREATININE 1.17 (H) 09/12/2016 0237   CALCIUM 8.6 (L) 09/12/2016 0237   PROT 6.4 (L) 09/11/2016 1131   ALBUMIN 3.9 09/11/2016 1131   AST 23 09/11/2016 1131   ALT 16 09/11/2016 1131   ALKPHOS 88 09/11/2016 1131   BILITOT 0.2 (L) 09/11/2016 1131   GFRNONAA 46 (L) 09/12/2016 0237   GFRAA 53 (L) 09/12/2016 0237   No results found for: CHOL, HDL, LDLCALC, LDLDIRECT, TRIG, CHOLHDL No results found for: HGBA1C No results found for: VITAMINB12 No results found for: TSH    ASSESSMENT AND PLAN 73 y.o. year old female  has a past medical history of GERD (gastroesophageal reflux disease), Thrombocytopenia ([place]), and Thyroid disease. here with:  Obstructive Sleep Apnea with CPAP use  Overall her download reveals excellent compliance.  I spoke with Dr. Brett Fairy regarding her compliance report.  We changed her settings to reflect a minimum pressure of 5 and a maximum pressure of 12 cmH20 and 3 cm EPR. She reports she will work with the technician to adjust her humidifier.  Originally she was to follow-up in 1 year however with the change in settings she will follow-up in 6 months.  I called the patient and let her know of the change in settings as well as the change in her follow-up.  She was very appreciative of the call and change in settings.  She will call our office if she develops any new symptoms or has any concerns.   I spent 15 minutes with the patient. 50% of this time was spent reviewing her compliance report and discussing her plan of care.    Butler Denmark, AGNP-C, DNP 06/21/2018, 10:14 AM [facility] [address] [place], [place] [address] [phone]

## 2018-06-22 NOTE — Telephone Encounter (Signed)
Good morning - notes pulled and pressure change will be made today per Christina from [place].

## 2018-07-06 DIAGNOSIS — G4733 Obstructive sleep apnea (adult) (pediatric): Secondary | ICD-10-CM | POA: Diagnosis not present

## 2018-07-27 DIAGNOSIS — L821 Other seborrheic keratosis: Secondary | ICD-10-CM | POA: Diagnosis not present

## 2018-08-04 DIAGNOSIS — G4733 Obstructive sleep apnea (adult) (pediatric): Secondary | ICD-10-CM | POA: Diagnosis not present

## 2018-08-23 DIAGNOSIS — K219 Gastro-esophageal reflux disease without esophagitis: Secondary | ICD-10-CM | POA: Diagnosis not present

## 2018-08-23 DIAGNOSIS — F321 Major depressive disorder, single episode, moderate: Secondary | ICD-10-CM | POA: Diagnosis not present

## 2018-08-23 DIAGNOSIS — I2510 Atherosclerotic heart disease of native coronary artery without angina pectoris: Secondary | ICD-10-CM | POA: Diagnosis not present

## 2018-08-23 DIAGNOSIS — F411 Generalized anxiety disorder: Secondary | ICD-10-CM | POA: Diagnosis not present

## 2018-08-23 DIAGNOSIS — E559 Vitamin D deficiency, unspecified: Secondary | ICD-10-CM | POA: Diagnosis not present

## 2018-08-23 DIAGNOSIS — G4733 Obstructive sleep apnea (adult) (pediatric): Secondary | ICD-10-CM | POA: Diagnosis not present

## 2018-08-23 DIAGNOSIS — F5101 Primary insomnia: Secondary | ICD-10-CM | POA: Diagnosis not present

## 2018-08-23 DIAGNOSIS — R030 Elevated blood-pressure reading, without diagnosis of hypertension: Secondary | ICD-10-CM | POA: Diagnosis not present

## 2018-08-23 DIAGNOSIS — Z955 Presence of coronary angioplasty implant and graft: Secondary | ICD-10-CM | POA: Diagnosis not present

## 2018-08-23 DIAGNOSIS — I255 Ischemic cardiomyopathy: Secondary | ICD-10-CM | POA: Diagnosis not present

## 2018-08-23 DIAGNOSIS — I5023 Acute on chronic systolic (congestive) heart failure: Secondary | ICD-10-CM | POA: Diagnosis not present

## 2018-08-23 DIAGNOSIS — R972 Elevated prostate specific antigen [PSA]: Secondary | ICD-10-CM | POA: Diagnosis not present

## 2018-08-23 DIAGNOSIS — E785 Hyperlipidemia, unspecified: Secondary | ICD-10-CM | POA: Diagnosis not present

## 2018-08-23 DIAGNOSIS — Z0000 Encounter for general adult medical examination without abnormal findings: Secondary | ICD-10-CM | POA: Diagnosis not present

## 2018-08-23 DIAGNOSIS — E039 Hypothyroidism, unspecified: Secondary | ICD-10-CM | POA: Diagnosis not present

## 2018-09-04 DIAGNOSIS — G4733 Obstructive sleep apnea (adult) (pediatric): Secondary | ICD-10-CM | POA: Diagnosis not present

## 2018-10-04 DIAGNOSIS — G4733 Obstructive sleep apnea (adult) (pediatric): Secondary | ICD-10-CM | POA: Diagnosis not present

## 2018-11-04 DIAGNOSIS — G4733 Obstructive sleep apnea (adult) (pediatric): Secondary | ICD-10-CM | POA: Diagnosis not present

## 2018-11-20 DIAGNOSIS — M859 Disorder of bone density and structure, unspecified: Secondary | ICD-10-CM | POA: Diagnosis not present

## 2018-11-20 DIAGNOSIS — E7849 Other hyperlipidemia: Secondary | ICD-10-CM | POA: Diagnosis not present

## 2018-11-27 DIAGNOSIS — R82998 Other abnormal findings in urine: Secondary | ICD-10-CM | POA: Diagnosis not present

## 2018-12-04 DIAGNOSIS — Z0000 Encounter for general adult medical examination without abnormal findings: Secondary | ICD-10-CM | POA: Diagnosis not present

## 2018-12-04 DIAGNOSIS — Z1331 Encounter for screening for depression: Secondary | ICD-10-CM | POA: Diagnosis not present

## 2018-12-04 DIAGNOSIS — N183 Chronic kidney disease, stage 3 (moderate): Secondary | ICD-10-CM | POA: Diagnosis not present

## 2018-12-04 DIAGNOSIS — H9190 Unspecified hearing loss, unspecified ear: Secondary | ICD-10-CM | POA: Diagnosis not present

## 2018-12-04 DIAGNOSIS — K5900 Constipation, unspecified: Secondary | ICD-10-CM | POA: Diagnosis not present

## 2018-12-04 DIAGNOSIS — N390 Urinary tract infection, site not specified: Secondary | ICD-10-CM | POA: Diagnosis not present

## 2018-12-04 DIAGNOSIS — E0590 Thyrotoxicosis, unspecified without thyrotoxic crisis or storm: Secondary | ICD-10-CM | POA: Diagnosis not present

## 2018-12-04 DIAGNOSIS — D696 Thrombocytopenia, unspecified: Secondary | ICD-10-CM | POA: Diagnosis not present

## 2018-12-04 DIAGNOSIS — E785 Hyperlipidemia, unspecified: Secondary | ICD-10-CM | POA: Diagnosis not present

## 2018-12-04 DIAGNOSIS — M8580 Other specified disorders of bone density and structure, unspecified site: Secondary | ICD-10-CM | POA: Diagnosis not present

## 2018-12-04 DIAGNOSIS — G4733 Obstructive sleep apnea (adult) (pediatric): Secondary | ICD-10-CM | POA: Diagnosis not present

## 2018-12-25 ENCOUNTER — Encounter (INDEPENDENT_AMBULATORY_CARE_PROVIDER_SITE_OTHER): Payer: Self-pay

## 2019-01-04 DIAGNOSIS — G4733 Obstructive sleep apnea (adult) (pediatric): Secondary | ICD-10-CM | POA: Diagnosis not present

## 2019-01-08 ENCOUNTER — Telehealth (INDEPENDENT_AMBULATORY_CARE_PROVIDER_SITE_OTHER): Payer: PPO | Admitting: Adult Health

## 2019-01-08 DIAGNOSIS — G4733 Obstructive sleep apnea (adult) (pediatric): Secondary | ICD-10-CM | POA: Diagnosis not present

## 2019-01-08 DIAGNOSIS — Z9989 Dependence on other enabling machines and devices: Secondary | ICD-10-CM

## 2019-01-08 NOTE — Progress Notes (Signed)
  [facility] [address] [place]. [place] [address] [phone]     Virtual Visit via Telephone Note  I connected with Heather Kirby on 01/08/19 at  1:30 PM EDT by telephone located remotely at [facility] and verified that I am speaking with the correct person using two identifiers who reports being located at home.    Visit scheduled by Ward Givens. I discussed the limitations, risks, security and privacy concerns of performing an evaluation and management service by telephone and the availability of in person appointments. I also discussed with the patient that there may be a patient responsible charge related to this service. The patient expressed understanding and agreed to proceed. See telephone note for consent and additional scheduling information.    History of Present Illness:  Heather Kirby is a 73 y.o. female who has been followed in this office for OSA on CPAP. She was initially scheduled for face-to-face office follow up visit today time but due to [place], visit rescheduled for Video visit but patients volume was not working so visit converted to non-face-to-face telephone visit with patients consent. Unable to participate in video visit due to lack of access to device with camera.    Today 01/08/19 Heather Kirby is a 73 year old female with a history of OSA on cpap.  Her download indicates that she used her machine nightly for compliance of 100%.  She used her machine greater than 4 hours each night.  On average she use her machine 6 hours and 59 minutes.  Her residual AHI is 5.5 on 5 to 12 cm of water.  She reports that she has tried turning the humidity up on her machine but the machine uses no water during the night.  She has [facility] her machine to [facility] and they advised that she get a humidifier for her house.  She does feel that there is something wrong with the machine.  She states that she wakes up with her mouth dry.  She denies  any other issues.   Observations/Objective:  Generalized: Well developed, in no acute distress   Neurological examination  Mentation: Alert oriented to time, place, history taking.  speech and language fluent  Assessment and Plan:  1.  Obstructive sleep apnea on CPAP  The patient CPAP download shows excellent compliance.  Her residual AHI is slightly elevated.  I will try increasing her pressure to 5-15 to see if this further decreases her apnea.  I have asked that [facility] look at her machine to see if there is any malfunction with the humidity setting.  Patient is advised that if her symptoms worsen or she develops new symptoms she should let us know.  She will follow-up in 1 year or sooner if needed.  Follow Up Instructions:   Follow-up in 1 year    I discussed the assessment and treatment plan with the patient.  The patient was provided an opportunity to ask questions and all were answered to their satisfaction. The patient agreed with the plan and verbalized an understanding of the instructions.   I provided 15 minutes of non-face-to-face time during this encounter.     Ward Givens, MSN, NP-C 01/08/2019, 1:43 PM [facility] [address], [place], [place] [address] [phone]

## 2019-01-28 DIAGNOSIS — G4733 Obstructive sleep apnea (adult) (pediatric): Secondary | ICD-10-CM | POA: Diagnosis not present

## 2019-02-04 DIAGNOSIS — G4733 Obstructive sleep apnea (adult) (pediatric): Secondary | ICD-10-CM | POA: Diagnosis not present

## 2019-02-28 DIAGNOSIS — L570 Actinic keratosis: Secondary | ICD-10-CM | POA: Diagnosis not present

## 2019-02-28 DIAGNOSIS — D485 Neoplasm of uncertain behavior of skin: Secondary | ICD-10-CM | POA: Diagnosis not present

## 2019-02-28 DIAGNOSIS — L578 Other skin changes due to chronic exposure to nonionizing radiation: Secondary | ICD-10-CM | POA: Diagnosis not present

## 2019-02-28 DIAGNOSIS — Z23 Encounter for immunization: Secondary | ICD-10-CM | POA: Diagnosis not present

## 2019-02-28 DIAGNOSIS — L821 Other seborrheic keratosis: Secondary | ICD-10-CM | POA: Diagnosis not present

## 2019-02-28 DIAGNOSIS — D225 Melanocytic nevi of trunk: Secondary | ICD-10-CM | POA: Diagnosis not present

## 2019-02-28 DIAGNOSIS — C44319 Basal cell carcinoma of skin of other parts of face: Secondary | ICD-10-CM | POA: Diagnosis not present

## 2019-02-28 DIAGNOSIS — L820 Inflamed seborrheic keratosis: Secondary | ICD-10-CM | POA: Diagnosis not present

## 2019-03-06 DIAGNOSIS — G4733 Obstructive sleep apnea (adult) (pediatric): Secondary | ICD-10-CM | POA: Diagnosis not present

## 2019-04-06 DIAGNOSIS — G4733 Obstructive sleep apnea (adult) (pediatric): Secondary | ICD-10-CM | POA: Diagnosis not present

## 2019-04-08 DIAGNOSIS — Z779 Other contact with and (suspected) exposures hazardous to health: Secondary | ICD-10-CM | POA: Diagnosis not present

## 2019-04-08 DIAGNOSIS — Z1231 Encounter for screening mammogram for malignant neoplasm of breast: Secondary | ICD-10-CM | POA: Diagnosis not present

## 2019-04-08 DIAGNOSIS — Z124 Encounter for screening for malignant neoplasm of cervix: Secondary | ICD-10-CM | POA: Diagnosis not present

## 2019-04-08 DIAGNOSIS — N958 Other specified menopausal and perimenopausal disorders: Secondary | ICD-10-CM | POA: Diagnosis not present

## 2019-04-08 DIAGNOSIS — M5117 Intervertebral disc disorders with radiculopathy, lumbosacral region: Secondary | ICD-10-CM | POA: Diagnosis not present

## 2019-04-08 DIAGNOSIS — M5416 Radiculopathy, lumbar region: Secondary | ICD-10-CM | POA: Diagnosis not present

## 2019-04-08 DIAGNOSIS — M8588 Other specified disorders of bone density and structure, other site: Secondary | ICD-10-CM | POA: Diagnosis not present

## 2019-04-08 DIAGNOSIS — Z6822 Body mass index (BMI) 22.0-22.9, adult: Secondary | ICD-10-CM | POA: Diagnosis not present

## 2019-04-29 DIAGNOSIS — C44319 Basal cell carcinoma of skin of other parts of face: Secondary | ICD-10-CM | POA: Diagnosis not present

## 2019-05-03 DIAGNOSIS — G4733 Obstructive sleep apnea (adult) (pediatric): Secondary | ICD-10-CM | POA: Diagnosis not present

## 2019-05-06 DIAGNOSIS — G4733 Obstructive sleep apnea (adult) (pediatric): Secondary | ICD-10-CM | POA: Diagnosis not present

## 2019-06-06 DIAGNOSIS — G4733 Obstructive sleep apnea (adult) (pediatric): Secondary | ICD-10-CM | POA: Diagnosis not present

## 2019-06-16 ENCOUNTER — Ambulatory Visit: Payer: Medicare Other

## 2019-06-18 ENCOUNTER — Encounter: Payer: Self-pay | Admitting: Adult Health

## 2019-06-21 ENCOUNTER — Ambulatory Visit: Payer: Medicare Other

## 2019-06-24 ENCOUNTER — Ambulatory Visit: Payer: PPO | Admitting: Adult Health

## 2019-06-24 DIAGNOSIS — C44629 Squamous cell carcinoma of skin of left upper limb, including shoulder: Secondary | ICD-10-CM | POA: Diagnosis not present

## 2019-06-24 DIAGNOSIS — D485 Neoplasm of uncertain behavior of skin: Secondary | ICD-10-CM | POA: Diagnosis not present

## 2019-07-08 DIAGNOSIS — C44629 Squamous cell carcinoma of skin of left upper limb, including shoulder: Secondary | ICD-10-CM | POA: Diagnosis not present

## 2019-07-10 ENCOUNTER — Ambulatory Visit: Payer: Medicare Other

## 2019-09-02 DIAGNOSIS — L814 Other melanin hyperpigmentation: Secondary | ICD-10-CM | POA: Diagnosis not present

## 2019-09-02 DIAGNOSIS — D225 Melanocytic nevi of trunk: Secondary | ICD-10-CM | POA: Diagnosis not present

## 2019-09-02 DIAGNOSIS — L578 Other skin changes due to chronic exposure to nonionizing radiation: Secondary | ICD-10-CM | POA: Diagnosis not present

## 2019-09-02 DIAGNOSIS — L570 Actinic keratosis: Secondary | ICD-10-CM | POA: Diagnosis not present

## 2019-09-02 DIAGNOSIS — L905 Scar conditions and fibrosis of skin: Secondary | ICD-10-CM | POA: Diagnosis not present

## 2019-09-02 DIAGNOSIS — Z85828 Personal history of other malignant neoplasm of skin: Secondary | ICD-10-CM | POA: Diagnosis not present

## 2019-09-03 ENCOUNTER — Encounter: Payer: Self-pay | Admitting: Adult Health

## 2019-11-06 DIAGNOSIS — G4733 Obstructive sleep apnea (adult) (pediatric): Secondary | ICD-10-CM | POA: Diagnosis not present

## 2019-11-11 ENCOUNTER — Telehealth: Payer: Self-pay | Admitting: Neurology

## 2019-11-11 NOTE — Telephone Encounter (Signed)
Patient called stating that she was informed there is a recall on the CPAP that she uses. She is requesting a call to her mobile number [phone]) to discuss.

## 2019-11-11 NOTE — Telephone Encounter (Signed)
Called the patient to discuss concerns. She has answered no to both questions in regards to the [place] recall. She states that she all of a sudden her mask started to have leak. Informed the patient to contact the DME company in regards to equipment and also to get set up on list to have her machine replaced. She verbalized understanding. Was also able to get her set up with yearly follow up visit

## 2019-12-06 DIAGNOSIS — M859 Disorder of bone density and structure, unspecified: Secondary | ICD-10-CM | POA: Diagnosis not present

## 2019-12-06 DIAGNOSIS — E7849 Other hyperlipidemia: Secondary | ICD-10-CM | POA: Diagnosis not present

## 2019-12-06 DIAGNOSIS — E0590 Thyrotoxicosis, unspecified without thyrotoxic crisis or storm: Secondary | ICD-10-CM | POA: Diagnosis not present

## 2019-12-13 DIAGNOSIS — Z0000 Encounter for general adult medical examination without abnormal findings: Secondary | ICD-10-CM | POA: Diagnosis not present

## 2019-12-13 DIAGNOSIS — G4733 Obstructive sleep apnea (adult) (pediatric): Secondary | ICD-10-CM | POA: Diagnosis not present

## 2019-12-13 DIAGNOSIS — E785 Hyperlipidemia, unspecified: Secondary | ICD-10-CM | POA: Diagnosis not present

## 2019-12-13 DIAGNOSIS — K449 Diaphragmatic hernia without obstruction or gangrene: Secondary | ICD-10-CM | POA: Diagnosis not present

## 2019-12-13 DIAGNOSIS — Z1339 Encounter for screening examination for other mental health and behavioral disorders: Secondary | ICD-10-CM | POA: Diagnosis not present

## 2019-12-13 DIAGNOSIS — N1831 Chronic kidney disease, stage 3a: Secondary | ICD-10-CM | POA: Diagnosis not present

## 2019-12-13 DIAGNOSIS — E0590 Thyrotoxicosis, unspecified without thyrotoxic crisis or storm: Secondary | ICD-10-CM | POA: Diagnosis not present

## 2019-12-13 DIAGNOSIS — M8580 Other specified disorders of bone density and structure, unspecified site: Secondary | ICD-10-CM | POA: Diagnosis not present

## 2019-12-13 DIAGNOSIS — R82998 Other abnormal findings in urine: Secondary | ICD-10-CM | POA: Diagnosis not present

## 2019-12-13 DIAGNOSIS — E875 Hyperkalemia: Secondary | ICD-10-CM | POA: Diagnosis not present

## 2019-12-13 DIAGNOSIS — D696 Thrombocytopenia, unspecified: Secondary | ICD-10-CM | POA: Diagnosis not present

## 2019-12-13 DIAGNOSIS — Z1331 Encounter for screening for depression: Secondary | ICD-10-CM | POA: Diagnosis not present

## 2019-12-13 DIAGNOSIS — M545 Low back pain: Secondary | ICD-10-CM | POA: Diagnosis not present

## 2019-12-19 DIAGNOSIS — Z1212 Encounter for screening for malignant neoplasm of rectum: Secondary | ICD-10-CM | POA: Diagnosis not present

## 2020-01-09 ENCOUNTER — Other Ambulatory Visit: Payer: Self-pay

## 2020-01-09 ENCOUNTER — Ambulatory Visit: Payer: PPO | Admitting: Adult Health

## 2020-01-09 VITALS — BP 118/76 | HR 88 | Ht 66.0 in | Wt 143.0 lb

## 2020-01-09 DIAGNOSIS — G4733 Obstructive sleep apnea (adult) (pediatric): Secondary | ICD-10-CM | POA: Diagnosis not present

## 2020-01-09 DIAGNOSIS — Z9989 Dependence on other enabling machines and devices: Secondary | ICD-10-CM | POA: Diagnosis not present

## 2020-01-09 NOTE — Progress Notes (Signed)
PATIENT: Heather Kirby DOB: 12-22-1945  REASON FOR VISIT: follow up HISTORY FROM: patient  HISTORY OF PRESENT ILLNESS: Today 01/09/20:  Ms. Heather Kirby is a 74 year old female with a history of obstructive sleep apnea on CPAP.  Her download indicates that she use her machine 28 out of 30 days for compliance of 93.3%.  She use her machine greater than 4 hours each night..  Her residual AHI is 5.5 on 5 to 15 cm of water.  She states that she stopped using her CPAP July 15.  She states that this is due to the recall.  She has registered her machine with [employer].  But she does not feel comfortable using her current CPAP.  She returns today for an evaluation.   REVIEW OF SYSTEMS: Out of a complete 14 system review of symptoms, the patient complains only of the following symptoms, and all other reviewed systems are negative.  See HPI  ALLERGIES: No Known Allergies  HOME MEDICATIONS: Outpatient Medications Prior to Visit  Medication Sig Dispense Refill  . aspirin EC 81 MG tablet Take 81 mg by mouth daily.    . Calcium Carbonate-Vit D-Min (CALCIUM 1200 PO) Take by mouth.    . diphenhydramine-acetaminophen (TYLENOL PM) 25-500 MG TABS tablet Take 1 tablet by mouth at bedtime.    . methimazole (TAPAZOLE) 5 MG tablet Take 5 mg by mouth 2 (two) times daily. Pt taking 1/2 tab a day per her pcp    . omeprazole (PRILOSEC) 20 MG capsule Take 20 mg by mouth daily.    . ondansetron (ZOFRAN) 4 MG tablet Take 1 tablet (4 mg total) by mouth every 8 (eight) hours as needed for nausea or vomiting. 20 tablet 0  . simvastatin (ZOCOR) 40 MG tablet Take 40 mg by mouth daily.    . traMADol (ULTRAM) 50 MG tablet Take 50 mg by mouth 3 (three) times daily as needed for moderate pain.     No facility-administered medications prior to visit.    PAST MEDICAL HISTORY: Past Medical History:  Diagnosis Date  . GERD (gastroesophageal reflux disease)   . Thrombocytopenia ([place])   . Thyroid disease    hyperthyroidism     PAST SURGICAL HISTORY: Past Surgical History:  Procedure Laterality Date  . CATARACT EXTRACTION W/ INTRAOCULAR LENS  IMPLANT, BILATERAL     right 8/18 and left was 9/18  . COLONOSCOPY    . SHOULDER ARTHROSCOPY Right     FAMILY HISTORY: Family History  Problem Relation Age of Onset  . Heart failure Mother     SOCIAL HISTORY: Social History   Socioeconomic History  . Marital status: Married    Spouse name: Not on file  . Number of children: Not on file  . Years of education: Not on file  . Highest education level: Not on file  Occupational History  . Not on file  Tobacco Use  . Smoking status: Never Smoker  . Smokeless tobacco: Never Used  Substance and Sexual Activity  . Alcohol use: Yes    Comment: occasional wine  . Drug use: No  . Sexual activity: Not on file  Other Topics Concern  . Not on file  Social History Narrative  . Not on file   Social Determinants of Health   Financial Resource Strain:   . Difficulty of Paying Living Expenses: Not on file  Food Insecurity:   . Worried About Charity fundraiser in the Last Year: Not on file  . Ran Out of  Food in the Last Year: Not on file  Transportation Needs:   . Lack of Transportation (Medical): Not on file  . Lack of Transportation (Non-Medical): Not on file  Physical Activity:   . Days of Exercise per Week: Not on file  . Minutes of Exercise per Session: Not on file  Stress:   . Feeling of Stress : Not on file  Social Connections:   . Frequency of Communication with Friends and Family: Not on file  . Frequency of Social Gatherings with Friends and Family: Not on file  . Attends Religious Services: Not on file  . Active Member of Clubs or Organizations: Not on file  . Attends Archivist Meetings: Not on file  . Marital Status: Not on file  Intimate Partner Violence:   . Fear of Current or Ex-Partner: Not on file  . Emotionally Abused: Not on file  . Physically Abused: Not on file  .  Sexually Abused: Not on file      PHYSICAL EXAM  Vitals:   01/09/20 1123  BP: 118/76  Pulse: 88  Weight: 143 lb (64.9 kg)  Height: 5\' 6"  (1.676 m)   Body mass index is 23.08 kg/m.  Generalized: Well developed, in no acute distress  Chest: Lungs clear to auscultation bilaterally  Neurological examination  Mentation: Alert oriented to time, place, history taking. Follows all commands speech and language fluent Cranial nerve II-XII: Extraocular movements were full, visual field were full on confrontational test Head turning and shoulder shrug  were normal and symmetric. Motor: The motor testing reveals 5 over 5 strength of all 4 extremities. Good symmetric motor tone is noted throughout.  Sensory: Sensory testing is intact to soft touch on all 4 extremities. No evidence of extinction is noted.  Gait and station: Gait is normal.    DIAGNOSTIC DATA (LABS, IMAGING, TESTING) - I reviewed patient records, labs, notes, testing and imaging myself where available.  Lab Results  Component Value Date   WBC 6.0 09/11/2016   HGB 12.0 09/11/2016   HCT 36.5 09/11/2016   MCV 92.2 09/11/2016   PLT 154 09/11/2016      Component Value Date/Time   NA 140 09/12/2016 0237   K 4.5 09/12/2016 0237   CL 109 09/12/2016 0237   CO2 26 09/12/2016 0237   GLUCOSE 105 (H) 09/12/2016 0237   BUN 18 09/12/2016 0237   CREATININE 1.17 (H) 09/12/2016 0237   CALCIUM 8.6 (L) 09/12/2016 0237   PROT 6.4 (L) 09/11/2016 1131   ALBUMIN 3.9 09/11/2016 1131   AST 23 09/11/2016 1131   ALT 16 09/11/2016 1131   ALKPHOS 88 09/11/2016 1131   BILITOT 0.2 (L) 09/11/2016 1131   GFRNONAA 46 (L) 09/12/2016 0237   GFRAA 53 (L) 09/12/2016 0237      ASSESSMENT AND PLAN 74 y.o. year old female  has a past medical history of GERD (gastroesophageal reflux disease), Thrombocytopenia ([place]), and Thyroid disease. here with:  1. OSA on CPAP  Previous CPAP compliance was excellent.  She is no longer using her machine due  to recall.  I have advised that she should reach out to her DME company to see if she is able to get a replacement, loaner or new machine.  I have also sent an order over to her DME company.  I have advised that she did have severe sleep apnea therefore we would like to get her restarted on therapy as soon as possible.  Follow-up in 1 year or sooner  if needed  I spent 25 minutes of face-to-face and non-face-to-face time with patient.  This included previsit chart review, lab review, study review, order entry, electronic health record documentation, patient education.  Ward Givens, MSN, NP-C 01/09/2020, 11:44 AM [facility] [address], [place], [place] [address] [phone]

## 2020-01-09 NOTE — Patient Instructions (Addendum)
Register your machine through [employer] DME company

## 2020-01-14 NOTE — Progress Notes (Addendum)
CM sent for cpap (message relating to getting machine due to recall). Hurley Cisco, RN; Renford Dills there!   We will take a look at the orders / docs; however, if it is truly a replacement because of hers being under recall... we won't be able to do it.   We have received strict rules from the FDA and [employer], as well as our Corporate offices, that we can not replace them.   [employer] is very close to being able to change out the recalled machines.   I will let you know, for sure. :)   Margreta Journey

## 2020-03-30 DIAGNOSIS — C44519 Basal cell carcinoma of skin of other part of trunk: Secondary | ICD-10-CM | POA: Diagnosis not present

## 2020-03-30 DIAGNOSIS — D485 Neoplasm of uncertain behavior of skin: Secondary | ICD-10-CM | POA: Diagnosis not present

## 2020-03-30 DIAGNOSIS — L821 Other seborrheic keratosis: Secondary | ICD-10-CM | POA: Diagnosis not present

## 2020-03-30 DIAGNOSIS — Z85828 Personal history of other malignant neoplasm of skin: Secondary | ICD-10-CM | POA: Diagnosis not present

## 2020-03-30 DIAGNOSIS — L570 Actinic keratosis: Secondary | ICD-10-CM | POA: Diagnosis not present

## 2020-03-30 DIAGNOSIS — L905 Scar conditions and fibrosis of skin: Secondary | ICD-10-CM | POA: Diagnosis not present

## 2020-04-07 DIAGNOSIS — R143 Flatulence: Secondary | ICD-10-CM | POA: Diagnosis not present

## 2020-04-07 DIAGNOSIS — R1013 Epigastric pain: Secondary | ICD-10-CM | POA: Diagnosis not present

## 2020-04-07 DIAGNOSIS — R8290 Unspecified abnormal findings in urine: Secondary | ICD-10-CM | POA: Diagnosis not present

## 2020-04-07 DIAGNOSIS — K5903 Drug induced constipation: Secondary | ICD-10-CM | POA: Diagnosis not present

## 2020-04-27 DIAGNOSIS — L905 Scar conditions and fibrosis of skin: Secondary | ICD-10-CM | POA: Diagnosis not present

## 2020-04-27 DIAGNOSIS — C44519 Basal cell carcinoma of skin of other part of trunk: Secondary | ICD-10-CM | POA: Diagnosis not present

## 2020-04-27 DIAGNOSIS — D0462 Carcinoma in situ of skin of left upper limb, including shoulder: Secondary | ICD-10-CM | POA: Diagnosis not present

## 2020-05-20 DIAGNOSIS — Z1231 Encounter for screening mammogram for malignant neoplasm of breast: Secondary | ICD-10-CM | POA: Diagnosis not present

## 2020-05-20 DIAGNOSIS — Z6822 Body mass index (BMI) 22.0-22.9, adult: Secondary | ICD-10-CM | POA: Diagnosis not present

## 2020-05-20 DIAGNOSIS — R87615 Unsatisfactory cytologic smear of cervix: Secondary | ICD-10-CM | POA: Diagnosis not present

## 2020-05-20 DIAGNOSIS — Z124 Encounter for screening for malignant neoplasm of cervix: Secondary | ICD-10-CM | POA: Diagnosis not present

## 2020-05-21 ENCOUNTER — Encounter: Payer: Self-pay | Admitting: Internal Medicine

## 2020-05-21 ENCOUNTER — Ambulatory Visit: Payer: PPO | Admitting: Internal Medicine

## 2020-05-21 ENCOUNTER — Other Ambulatory Visit (INDEPENDENT_AMBULATORY_CARE_PROVIDER_SITE_OTHER): Payer: PPO

## 2020-05-21 VITALS — BP 96/60 | HR 88 | Ht 66.0 in | Wt 138.1 lb

## 2020-05-21 DIAGNOSIS — L905 Scar conditions and fibrosis of skin: Secondary | ICD-10-CM | POA: Diagnosis not present

## 2020-05-21 DIAGNOSIS — C44519 Basal cell carcinoma of skin of other part of trunk: Secondary | ICD-10-CM | POA: Diagnosis not present

## 2020-05-21 DIAGNOSIS — K219 Gastro-esophageal reflux disease without esophagitis: Secondary | ICD-10-CM

## 2020-05-21 DIAGNOSIS — R1010 Upper abdominal pain, unspecified: Secondary | ICD-10-CM

## 2020-05-21 DIAGNOSIS — K5909 Other constipation: Secondary | ICD-10-CM | POA: Diagnosis not present

## 2020-05-21 LAB — COMPREHENSIVE METABOLIC PANEL
ALT: 76 U/L — ABNORMAL HIGH (ref 0–35)
AST: 60 U/L — ABNORMAL HIGH (ref 0–37)
Albumin: 4.7 g/dL (ref 3.5–5.2)
Alkaline Phosphatase: 78 U/L (ref 39–117)
BUN: 19 mg/dL (ref 6–23)
CO2: 28 mEq/L (ref 19–32)
Calcium: 9.9 mg/dL (ref 8.4–10.5)
Chloride: 105 mEq/L (ref 96–112)
Creatinine, Ser: 1.29 mg/dL — ABNORMAL HIGH (ref 0.40–1.20)
GFR: 40.55 mL/min — ABNORMAL LOW (ref >60.00)
Glucose, Bld: 121 mg/dL — ABNORMAL HIGH (ref 70–99)
Potassium: 4.3 mEq/L (ref 3.5–5.1)
Sodium: 139 mEq/L (ref 135–145)
Total Bilirubin: 0.6 mg/dL (ref 0.2–1.2)
Total Protein: 7.2 g/dL (ref 6.0–8.3)

## 2020-05-21 LAB — CBC WITH DIFFERENTIAL/PLATELET
Basophils Absolute: 0 10*3/uL (ref 0.0–0.1)
Basophils Relative: 0.6 % (ref 0.0–3.0)
Eosinophils Absolute: 0.1 10*3/uL (ref 0.0–0.7)
Eosinophils Relative: 1.3 % (ref 0.0–5.0)
HCT: 43 % (ref 36.0–46.0)
Hemoglobin: 14.6 g/dL (ref 12.0–15.0)
Lymphocytes Relative: 24 % (ref 12.0–46.0)
Lymphs Abs: 1.6 10*3/uL (ref 0.7–4.0)
MCHC: 33.8 g/dL (ref 30.0–36.0)
MCV: 88.9 fl (ref 78.0–100.0)
Monocytes Absolute: 0.4 10*3/uL (ref 0.1–1.0)
Monocytes Relative: 5.8 % (ref 3.0–12.0)
Neutro Abs: 4.6 10*3/uL (ref 1.4–7.7)
Neutrophils Relative %: 68.3 % (ref 43.0–77.0)
Platelets: 123 10*3/uL — ABNORMAL LOW (ref 150.0–400.0)
RBC: 4.84 Mil/uL (ref 3.87–5.11)
RDW: 14.1 % (ref 11.5–15.5)
WBC: 6.8 10*3/uL (ref 4.0–10.5)

## 2020-05-21 LAB — LIPASE: Lipase: 29 U/L (ref 11.0–59.0)

## 2020-05-21 NOTE — Patient Instructions (Signed)
If you are age 76 or older, your body mass index should be between 23-30. Your Body mass index is 22.29 kg/m. If this is out of the aforementioned range listed, please consider follow up with your Primary Care Provider.  If you are age 57 or younger, your body mass index should be between 19-25. Your Body mass index is 22.29 kg/m. If this is out of the aformentioned range listed, please consider follow up with your Primary Care Provider.   Your provider has requested that you go to the basement level for lab work before leaving today. Press "B" on the elevator. The lab is located at the first door on the left as you exit the elevator.  You have been scheduled for an abdominal ultrasound at [facility] (1st floor of hospital) on 06/01/2020 at 9:00am. Please arrive 15 minutes prior to your appointment for registration. Make certain not to have anything to eat or drink after midgnight prior to your appointment. Should you need to reschedule your appointment, please contact radiology at [phone]. This test typically takes about 30 minutes to perform.  You have been scheduled for an endoscopy. Please follow written instructions given to you at your visit today. If you use inhalers (even only as needed), please bring them with you on the day of your procedure.

## 2020-05-21 NOTE — Progress Notes (Signed)
HISTORY OF PRESENT ILLNESS:  Heather Kirby is a 75 y.o. female with limited past medical history as listed below who is sent today by her primary care provider and urgent care regarding problems with new onset abdominal pain.  The patient states that she was in her usual state of health until mid November when, one afternoon, she had sudden onset severe upper abdominal pain with radiation into the back (right greater than left).  She described the pain as excruciating.  Self medicated with Gaviscon and diet Coke in hopes of belching.  Discomfort eventually resolved within 20 to 30 minutes.  She experienced no problems thereafter until 1 week later when she describes similar episode which was more severe.  This brought her to urgent care on April 07, 2020.  There was no associated shortness of breath, nausea, or vomiting.  This episode lasted 3 to 4 hours.  Her evaluation in urgent care was limited to urinalysis.  She was told that she probably had an ulcer.  She does take omeprazole daily for GERD and was on this throughout.  She was prescribed sucralfate, simethicone, and Movantik for constipation.  She does have chronic constipation, but states this was not worse during these episodes.  She did undergo abdominal ultrasound of the right upper quadrant April 2018 to evaluate abdominal pain and nausea.  This revealed a hepatic cyst.  Otherwise normal.  No gallstones.  CT scan in 2016 to evaluate abdominal bloating and weight loss was unremarkable.  Patient continues to have intermittent discomfort, though less.  She has had several colonoscopies, by her report, and [facility].  Being requested.  She has not had upper endoscopy to the best of her knowledge.  She did see her gynecologist yesterday and had a favorable evaluation.  She is having skin cancer surgery later today.  Patient denies new medications prior to her issues with abdominal pain.  She does not use alcohol to any significant degree.  She has  completed her COVID vaccination series  REVIEW OF SYSTEMS:  All non-GI ROS negative unless otherwise stated in the HPI except for hearing problems, arthritis, sinus allergy  Past Medical History:  Diagnosis Date  . Arthritis   . GERD (gastroesophageal reflux disease)   . HLD (hyperlipidemia)   . Hyperthyroidism   . Sleep apnea   . Thrombocytopenia ([place])     Past Surgical History:  Procedure Laterality Date  . CATARACT EXTRACTION W/ INTRAOCULAR LENS  IMPLANT, BILATERAL     right 8/18 and left was 9/18  . COLONOSCOPY    . SHOULDER ARTHROSCOPY Right     Social History Heather Kirby  reports that she has never smoked. She has never used smokeless tobacco. She reports current alcohol use. She reports that she does not use drugs.  family history includes Breast cancer in her sister; Diabetes in her father; Heart disease in her father, maternal aunt, and sister; Heart failure in her mother; Hiatal hernia in her maternal grandmother; Hypertension in her mother.  No Known Allergies     PHYSICAL EXAMINATION: Vital signs: BP 96/60 (BP Location: Left Arm, Patient Position: Sitting, Cuff Size: Normal)   Pulse 88   Ht 5\' 6"  (1.676 m) Comment: height measured without shoes  Wt 138 lb 2 oz (62.7 kg)   BMI 22.29 kg/m   Constitutional: generally well-appearing, no acute distress Psychiatric: alert and oriented x3, cooperative Eyes: extraocular movements intact, anicteric, conjunctiva pink Mouth: oral pharynx moist, no lesions Neck: supple no lymphadenopathy  Cardiovascular: heart regular rate and rhythm, no murmur Lungs: clear to auscultation bilaterally Abdomen: soft, nontender, nondistended, no obvious ascites, no peritoneal signs, normal bowel sounds, no organomegaly Rectal: Omitted Extremities: no clubbing, cyanosis, or lower extremity edema bilaterally Skin: no lesions on visible extremities Neuro: No focal deficits.  Cranial nerves intact  ASSESSMENT:  1.  Severe upper  abdominal pain as described.  Most consistent with biliary colic.  This despite negative gallbladder ultrasound 4 years ago.  Ulcer disease would seem less likely given chronic PPI use.  She does not use NSAIDs. 2.  Reports of colonoscopy elsewhere 3.  GERD.  Classic symptoms controlled with PPI 4.  Chronic constipation.  Treated successfully with MiraLAX  PLAN:  1.  CBC, comprehensive metabolic panel, lipase today 2.  Abdominal ultrasound to evaluate upper abdominal pain.  Rule out gallstones 3.  Upper endoscopy to evaluate upper abdominal pain.  Rule out ulcer disease or other processes. 4.  Outside colonoscopy reports have been requested.  Release signed. 5.  Continue PPI 6.  Further recommendations and follow-up after the above completed A total time of 60 minutes was spent preparing to see the patient, reviewing outside tests and x-rays.  Obtaining comprehensive history.  Performing comprehensive physical examination.  Counseling the patient regarding the above listed issues.  Ordering additional laboratories, advanced imaging, and endoscopic procedure.  Finally, documenting clinical information in the health record

## 2020-06-01 ENCOUNTER — Encounter: Payer: Self-pay | Admitting: Internal Medicine

## 2020-06-01 ENCOUNTER — Ambulatory Visit (HOSPITAL_COMMUNITY): Payer: PPO

## 2020-06-09 ENCOUNTER — Other Ambulatory Visit: Payer: Self-pay

## 2020-06-09 ENCOUNTER — Ambulatory Visit (HOSPITAL_COMMUNITY)
Admission: RE | Admit: 2020-06-09 | Discharge: 2020-06-09 | Disposition: A | Discharge status: Home / Self Care | Payer: PPO | Source: Ambulatory Visit | Attending: Internal Medicine | Admitting: Internal Medicine

## 2020-06-09 DIAGNOSIS — R1010 Upper abdominal pain, unspecified: Secondary | ICD-10-CM | POA: Insufficient documentation

## 2020-06-09 DIAGNOSIS — N281 Cyst of kidney, acquired: Secondary | ICD-10-CM | POA: Diagnosis not present

## 2020-06-10 DIAGNOSIS — Z124 Encounter for screening for malignant neoplasm of cervix: Secondary | ICD-10-CM | POA: Diagnosis not present

## 2020-06-15 ENCOUNTER — Ambulatory Visit (AMBULATORY_SURGERY_CENTER): Payer: PPO | Admitting: Internal Medicine

## 2020-06-15 ENCOUNTER — Encounter: Payer: Self-pay | Admitting: Internal Medicine

## 2020-06-15 ENCOUNTER — Other Ambulatory Visit: Payer: Self-pay

## 2020-06-15 VITALS — BP 120/61 | HR 68 | Temp 97.4°F | Resp 13 | Ht 66.0 in | Wt 138.0 lb

## 2020-06-15 DIAGNOSIS — K219 Gastro-esophageal reflux disease without esophagitis: Secondary | ICD-10-CM | POA: Diagnosis not present

## 2020-06-15 DIAGNOSIS — E785 Hyperlipidemia, unspecified: Secondary | ICD-10-CM | POA: Diagnosis not present

## 2020-06-15 DIAGNOSIS — R1010 Upper abdominal pain, unspecified: Secondary | ICD-10-CM

## 2020-06-15 DIAGNOSIS — G4733 Obstructive sleep apnea (adult) (pediatric): Secondary | ICD-10-CM | POA: Diagnosis not present

## 2020-06-15 DIAGNOSIS — M1990 Unspecified osteoarthritis, unspecified site: Secondary | ICD-10-CM | POA: Diagnosis not present

## 2020-06-15 MED ORDER — HYOSCYAMINE SULFATE 0.125 MG SL SUBL: SUBLINGUAL_TABLET | SUBLINGUAL | 1 refills | Status: DC

## 2020-06-15 MED ORDER — SODIUM CHLORIDE 0.9 % IV SOLN: 500.0000 mL | Freq: Once | INTRAVENOUS | Status: DC

## 2020-06-15 NOTE — Patient Instructions (Signed)
You may resume your previous diet and medication schedule.  Thank you for allowing us to care for you today!!!   YOU HAD AN ENDOSCOPIC PROCEDURE TODAY AT THE [facility]:   Refer to the procedure report that was given to you for any specific questions about what was found during the examination.  If the procedure report does not answer your questions, please call your gastroenterologist to clarify.  If you requested that your care partner not be given the details of your procedure findings, then the procedure report has been included in a sealed envelope for you to review at your convenience later.  YOU SHOULD EXPECT: Some feelings of bloating in the abdomen. Passage of more gas than usual.  Walking can help get rid of the air that was put into your GI tract during the procedure and reduce the bloating.   Please Note:  You might notice some irritation and congestion in your nose or some drainage.  This is from the oxygen used during your procedure.  There is no need for concern and it should clear up in a day or so.  SYMPTOMS TO REPORT IMMEDIATELY:  Following upper endoscopy (EGD)  Vomiting of blood or coffee ground material  New chest pain or pain under the shoulder blades  Painful or persistently difficult swallowing  New shortness of breath  Fever of 100F or higher  Black, tarry-looking stools  For urgent or emergent issues, a gastroenterologist can be reached at any hour by calling [phone]. Do not use MyChart messaging for urgent concerns.    DIET:  We do recommend a small meal at first, but then you may proceed to your regular diet.  Drink plenty of fluids but you should avoid alcoholic beverages for 24 hours.  ACTIVITY:  You should plan to take it easy for the rest of today and you should NOT DRIVE or use heavy machinery until tomorrow (because of the sedation medicines used during the test).    FOLLOW UP: Our staff will call the number listed on your records  Wednesday morning between 7:15 am and 8:15 am to check on you and address any questions or concerns that you may have regarding the information given to you following your procedure. If we do not reach you, we will leave a message.  We will attempt to reach you two times.  During this call, we will ask if you have developed any symptoms of COVID 19. If you develop any symptoms (ie: fever, flu-like symptoms, shortness of breath, cough etc.) before then, please call [phone].  If you test positive for Covid 19 in the 2 weeks post procedure, please call and report this information to us.    If any biopsies were taken you will be contacted by phone or by letter within the next 1-3 weeks.  Please call us at [phone] if you have not heard about the biopsies in 3 weeks.    SIGNATURES/CONFIDENTIALITY: You and/or your care partner have signed paperwork which will be entered into your electronic medical record.  These signatures attest to the fact that that the information above on your After Visit Summary has been reviewed and is understood.  Full responsibility of the confidentiality of this discharge information lies with you and/or your care-partner.  

## 2020-06-15 NOTE — Progress Notes (Signed)
Report to PACU, RN, vss, BBS= Clear.  

## 2020-06-15 NOTE — Op Note (Signed)
[facility] Patient Name: Heather Kirby Procedure Date: 06/15/2020 3:21 PM MRN: [id number] Endoscopist: Docia Chuck. Henrene Pastor , MD Age: 75 Referring MD:  Date of Birth: [birth year] Gender: Female Account #: [id number] Procedure:                Upper GI endoscopy Indications:              Epigastric abdominal pain Medicines:                Monitored Anesthesia Care Procedure:                Pre-Anesthesia Assessment:                           - Prior to the procedure, a History and Physical                            was performed, and patient medications and                            allergies were reviewed. The patient's tolerance of                            previous anesthesia was also reviewed. The risks                            and benefits of the procedure and the sedation                            options and risks were discussed with the patient.                            All questions were answered, and informed consent                            was obtained. Prior Anticoagulants: The patient has                            taken no previous anticoagulant or antiplatelet                            agents. ASA Grade Assessment: II - A patient with                            mild systemic disease. After reviewing the risks                            and benefits, the patient was deemed in                            satisfactory condition to undergo the procedure.                           After obtaining informed consent, the endoscope was  passed under direct vision. Throughout the                            procedure, the patient's blood pressure, pulse, and                            oxygen saturations were monitored continuously. The                            Endoscope was introduced through the mouth, and                            advanced to the second part of duodenum. The upper                            GI endoscopy was accomplished  without difficulty.                            The patient tolerated the procedure well. Scope In: Scope Out: Findings:                 The esophagus was normal.                           The stomach was normal.                           The examined duodenum was normal.                           The cardia and gastric fundus were normal on                            retroflexion. Complications:            No immediate complications. Estimated Blood Loss:     Estimated blood loss: none. Impression:               1. Normal EGD                           2. No cause for pain found on endoscopy. Recommendation:           1. Patient has a contact number available for                            emergencies. The signs and symptoms of potential                            delayed complications were discussed with the                            patient. Return to normal activities tomorrow.                            Written discharge instructions were provided to the  patient.                           2. Resume previous diet.                           3. Continue present medications.                           4. Prescribe Levsin sublingual (or generic                            equivalent) 0.125 mg; #60; 1 refill. 1-2                            sublingually every 4 hours as needed for pain                           5. Office follow-up with Dr. Henrene Pastor in 2 months.                            Contact the office in the interim for any questions                            or problems Yuval Rubens N. Henrene Pastor, MD 06/15/2020 3:49:28 PM This report has been signed electronically.

## 2020-06-15 NOTE — Progress Notes (Signed)
Pt's states no medical or surgical changes since previsit or office visit.  CW  abd [place] last week - no gallstones

## 2020-06-17 ENCOUNTER — Telehealth: Payer: Self-pay

## 2020-06-17 NOTE — Telephone Encounter (Signed)
  Follow up Call-  Call back number 06/15/2020  Post procedure Call Back phone  # [phone]  Permission to leave phone message Yes  Some recent data might be hidden     Patient questions:  Do you have a fever, pain , or abdominal swelling? No. Pain Score  0 *  Have you tolerated food without any problems? Yes.    Have you been able to return to your normal activities? Yes.    Do you have any questions about your discharge instructions: Diet   No. Medications  No. Follow up visit  No.  Do you have questions or concerns about your Care? No.  Actions: * If pain score is 4 or above: No action needed, pain <4.  1. Have you developed a fever since your procedure? no  2.   Have you had an respiratory symptoms (SOB or cough) since your procedure? no  3.   Have you tested positive for COVID 19 since your procedure no  4.   Have you had any family members/close contacts diagnosed with the COVID 19 since your procedure?  no   If yes to any of these questions please route to Joylene John, RN and Joella Prince, RN

## 2020-06-26 DIAGNOSIS — K5901 Slow transit constipation: Secondary | ICD-10-CM | POA: Diagnosis not present

## 2020-06-26 DIAGNOSIS — R42 Dizziness and giddiness: Secondary | ICD-10-CM | POA: Diagnosis not present

## 2020-06-26 DIAGNOSIS — E785 Hyperlipidemia, unspecified: Secondary | ICD-10-CM | POA: Diagnosis not present

## 2020-06-26 DIAGNOSIS — G4733 Obstructive sleep apnea (adult) (pediatric): Secondary | ICD-10-CM | POA: Diagnosis not present

## 2020-06-26 DIAGNOSIS — K219 Gastro-esophageal reflux disease without esophagitis: Secondary | ICD-10-CM | POA: Diagnosis not present

## 2020-06-26 DIAGNOSIS — E0590 Thyrotoxicosis, unspecified without thyrotoxic crisis or storm: Secondary | ICD-10-CM | POA: Diagnosis not present

## 2020-06-26 DIAGNOSIS — M5117 Intervertebral disc disorders with radiculopathy, lumbosacral region: Secondary | ICD-10-CM | POA: Diagnosis not present

## 2020-08-12 ENCOUNTER — Ambulatory Visit: Payer: PPO | Admitting: Internal Medicine

## 2020-08-12 ENCOUNTER — Encounter: Payer: Self-pay | Admitting: Internal Medicine

## 2020-08-12 VITALS — BP 110/70 | HR 88 | Ht 66.0 in | Wt 138.8 lb

## 2020-08-12 DIAGNOSIS — K219 Gastro-esophageal reflux disease without esophagitis: Secondary | ICD-10-CM | POA: Diagnosis not present

## 2020-08-12 DIAGNOSIS — K5909 Other constipation: Secondary | ICD-10-CM | POA: Diagnosis not present

## 2020-08-12 DIAGNOSIS — R109 Unspecified abdominal pain: Secondary | ICD-10-CM | POA: Diagnosis not present

## 2020-08-12 NOTE — Patient Instructions (Signed)
Please follow up as needed 

## 2020-08-12 NOTE — Progress Notes (Addendum)
HISTORY OF PRESENT ILLNESS:  Heather Kirby is a 75 y.o. female who was evaluated May 21, 2020 for severe episodic upper abdominal pain.  See that dictation for details.  Blood work at that time revealed mildly elevated hepatic transaminases with AST 16 and ALT 76.  Other liver tests were normal.  Creatinine 1.29.  Hemoglobin 14.6.  Normal white blood cell count.  Platelets 123,000.  Abdominal ultrasound was performed and was unremarkable except for a chronic stable cystic area on the liver.  She subsequently underwent upper endoscopy June 15, 2020.  The examination was normal.  She was continued on PPI and prescribe Levsin sublingual as needed.  Patient tells me that she has done well since her endoscopy.  She has had no significant problems with pain.  When she does have pain typically lasts for less than 1 minute.  She has occasionally used Levsin which she thinks might help, though the pain is so short-lived it is not certain.  In any event, she is glad that she is feeling better.  She does use a Philips-based laxative for her chronic constipation.  This works nicely.  No side effects.  Her current medication for GERD is omeprazole 20 mg daily.  REVIEW OF SYSTEMS:  All non-GI ROS negative unless otherwise stated in HPI except for dizziness  Past Medical History:  Diagnosis Date   Arthritis    GERD (gastroesophageal reflux disease)    HLD (hyperlipidemia)    Hyperthyroidism    Sleep apnea    Thrombocytopenia ([place])     Past Surgical History:  Procedure Laterality Date   CATARACT EXTRACTION W/ INTRAOCULAR LENS  IMPLANT, BILATERAL     right 8/18 and left was 9/18   COLONOSCOPY     SHOULDER ARTHROSCOPY Right     Social History Heather Kirby  reports that she has never smoked. She has never used smokeless tobacco. She reports current alcohol use. She reports that she does not use drugs.  family history includes Breast cancer in her sister; Diabetes in her father; Heart disease in  her father, maternal aunt, and sister; Heart failure in her mother; Hiatal hernia in her maternal grandmother; Hypertension in her mother.  No Known Allergies     PHYSICAL EXAMINATION: Vital signs: BP 110/70   Pulse 88   Ht 5\' 6"  (1.676 m)   Wt 138 lb 12.8 oz (63 kg)   BMI 22.40 kg/m   Constitutional: generally well-appearing, no acute distress Psychiatric: alert and oriented x3, cooperative Eyes: extraocular movements intact, anicteric, conjunctiva pink Mouth: oral pharynx moist, no lesions Neck: supple no lymphadenopathy Cardiovascular: heart regular rate and rhythm. Lungs: clear to auscultation bilaterally Abdomen: soft, nontender, nondistended, no obvious ascites, no peritoneal signs, normal bowel sounds, no organomegaly Rectal: Omitted Extremities: no clubbing, cyanosis, or lower extremity edema bilaterally Skin: no lesions on visible extremities Neuro: No focal deficits.  Cranial nerves intact  ASSESSMENT:  1.  Previous problems with episodic upper abdominal pain.  Negative extensive work-up.  Possible etiologies include GERD, spasm, or musculoskeletal. 2.  Chronic constipation.  Controlled with laxative choice 3.  GERD.  No alarm features.  Classic symptoms currently controlled on omeprazole 4.  Colonoscopies elsewhere.  Yet to receive  PLAN:  1.  Reflux precautions 2.  Continue PPI 3.  Levsin sublingual as needed 4.  Resume general medical care with PCP.  GI follow-up as needed  ADDENDUM: Outside colonoscopy report received.  Patient underwent complete colonoscopy with Dr. Teena Irani April 04, 2016.  Preparation was good.  The examination complete.  Entire examination was normal. As such, no routine follow-up screening is recommended.

## 2020-08-14 ENCOUNTER — Ambulatory Visit: Payer: PPO | Admitting: Internal Medicine

## 2020-09-28 DIAGNOSIS — C44729 Squamous cell carcinoma of skin of left lower limb, including hip: Secondary | ICD-10-CM | POA: Diagnosis not present

## 2020-09-28 DIAGNOSIS — L820 Inflamed seborrheic keratosis: Secondary | ICD-10-CM | POA: Diagnosis not present

## 2020-09-28 DIAGNOSIS — D485 Neoplasm of uncertain behavior of skin: Secondary | ICD-10-CM | POA: Diagnosis not present

## 2020-09-28 DIAGNOSIS — C44712 Basal cell carcinoma of skin of right lower limb, including hip: Secondary | ICD-10-CM | POA: Diagnosis not present

## 2020-09-28 DIAGNOSIS — C44629 Squamous cell carcinoma of skin of left upper limb, including shoulder: Secondary | ICD-10-CM | POA: Diagnosis not present

## 2020-09-28 DIAGNOSIS — L905 Scar conditions and fibrosis of skin: Secondary | ICD-10-CM | POA: Diagnosis not present

## 2020-09-28 DIAGNOSIS — Z85828 Personal history of other malignant neoplasm of skin: Secondary | ICD-10-CM | POA: Diagnosis not present

## 2020-10-20 DIAGNOSIS — D0462 Carcinoma in situ of skin of left upper limb, including shoulder: Secondary | ICD-10-CM | POA: Diagnosis not present

## 2020-10-20 DIAGNOSIS — D0472 Carcinoma in situ of skin of left lower limb, including hip: Secondary | ICD-10-CM | POA: Diagnosis not present

## 2020-10-27 DIAGNOSIS — E039 Hypothyroidism, unspecified: Secondary | ICD-10-CM | POA: Diagnosis not present

## 2020-11-03 DIAGNOSIS — C44712 Basal cell carcinoma of skin of right lower limb, including hip: Secondary | ICD-10-CM | POA: Diagnosis not present

## 2020-11-03 DIAGNOSIS — D0462 Carcinoma in situ of skin of left upper limb, including shoulder: Secondary | ICD-10-CM | POA: Diagnosis not present

## 2020-11-24 DIAGNOSIS — D0462 Carcinoma in situ of skin of left upper limb, including shoulder: Secondary | ICD-10-CM | POA: Diagnosis not present

## 2021-01-12 DIAGNOSIS — E785 Hyperlipidemia, unspecified: Secondary | ICD-10-CM | POA: Diagnosis not present

## 2021-01-12 DIAGNOSIS — E0590 Thyrotoxicosis, unspecified without thyrotoxic crisis or storm: Secondary | ICD-10-CM | POA: Diagnosis not present

## 2021-01-12 NOTE — Progress Notes (Signed)
PATIENT: Heather Kirby DOB: January 13, 1946  REASON FOR VISIT: follow up HISTORY FROM: patient  HISTORY OF PRESENT ILLNESS: Today 01/12/21: Heather Kirby is a 75 y.o. female being followed here for obstructive sleep apnea on CPAP. She is here for a follow up. Patient didn't use her CPAP during travel over the last month. Her download between 08/21/20-11/18/20 shows a compliance of 100% with and average usage of 6 hours and 52 minutes per night. She uses her machine greater than 4 hours for 98.9% of the days. Her AHI is 5.2 on an auto pressure setting of 4 cmH2O to 20 cmH2O. Patient endorses that her memory has improved slightly since she started using her new machine which had begun when she stopped using her old machine that had been recalled. She reports that's she is sleeping much better with her new machine.  Patient reports 6 episodes of chest pain over the last year, in which she has seen her PCP and has been referred to GI for evaluation. All test were unremarkable per the patient. She states she is seeing her PCP on Friday and will bring this up with him and I informed the patient to keep the office updated.   01/09/20: Heather Kirby is a 75 year old female with a history of obstructive sleep apnea on CPAP.  Her download indicates that she use her machine 28 out of 30 days for compliance of 93.3%.  She use her machine greater than 4 hours each night..  Her residual AHI is 5.5 on 5 to 15 cm of water.  She states that she stopped using her CPAP July 15.  She states that this is due to the recall.  She has registered her machine with [facility].  But she does not feel comfortable using her current CPAP.  She returns today for an evaluation.   REVIEW OF SYSTEMS: Out of a complete 14 system review of symptoms, the patient complains only of the following symptoms, and all other reviewed systems are negative.  See HPI  ALLERGIES: No Known Allergies  HOME MEDICATIONS: Outpatient Medications Prior to Visit   Medication Sig Dispense Refill   Alum Hydroxide-Mag Carbonate (GAVISCON PO) Take 1 tablet by mouth as needed.     aspirin EC 81 MG tablet Take 81 mg by mouth daily.     Calcium Carbonate-Vit D-Min (CALCIUM 1200 PO) Take by mouth.     diphenhydramine-acetaminophen (TYLENOL PM) 25-500 MG TABS tablet Take 1 tablet by mouth at bedtime.     hyoscyamine (LEVSIN SL) 0.125 MG SL tablet 1-2 sublingually every 4 hours as need for pain 60 tablet 1   methimazole (TAPAZOLE) 5 MG tablet Take 2.5 mg by mouth 2 (two) times daily.     naloxegol oxalate (MOVANTIK) 25 MG TABS tablet Movantik 25 mg tablet  TAKE 1 TABLET BY MOUTH ONCE DAILY IN THE MORNING     omeprazole (PRILOSEC) 20 MG capsule Take 20 mg by mouth daily.     ondansetron (ZOFRAN) 4 MG tablet Take 1 tablet (4 mg total) by mouth every 8 (eight) hours as needed for nausea or vomiting. 20 tablet 0   simvastatin (ZOCOR) 40 MG tablet Take 40 mg by mouth daily.     traMADol (ULTRAM) 50 MG tablet Take 50 mg by mouth 3 (three) times daily as needed for moderate pain.     No facility-administered medications prior to visit.    PAST MEDICAL HISTORY: Past Medical History:  Diagnosis Date   Arthritis  GERD (gastroesophageal reflux disease)    HLD (hyperlipidemia)    Hyperthyroidism    Sleep apnea    Thrombocytopenia ([facility])     PAST SURGICAL HISTORY: Past Surgical History:  Procedure Laterality Date   CATARACT EXTRACTION W/ INTRAOCULAR LENS  IMPLANT, BILATERAL     right 8/18 and left was 9/18   COLONOSCOPY     SHOULDER ARTHROSCOPY Right     FAMILY HISTORY: Family History  Problem Relation Age of Onset   Heart failure Mother    Hypertension Mother    Diabetes Father    Heart disease Father    Breast cancer Sister    Heart disease Sister    Hiatal hernia Maternal Grandmother    Heart disease Maternal Aunt    Stomach cancer Neg Hx    Rectal cancer Neg Hx    Esophageal cancer Neg Hx    Colon cancer Neg Hx     SOCIAL HISTORY: Social  History   Socioeconomic History   Marital status: Married    Spouse name: Not on file   Number of children: 1   Years of education: Not on file   Highest education level: Not on file  Occupational History   Occupation: retired  Tobacco Use   Smoking status: Never   Smokeless tobacco: Never  Vaping Use   Vaping Use: Never used  Substance and Sexual Activity   Alcohol use: Yes    Comment: occasional wine   Drug use: No   Sexual activity: Not on file  Other Topics Concern   Not on file  Social History Narrative   Not on file   Social Determinants of Health   Financial Resource Strain: Not on file  Food Insecurity: Not on file  Transportation Needs: Not on file  Physical Activity: Not on file  Stress: Not on file  Social Connections: Not on file  Intimate Partner Violence: Not on file      PHYSICAL EXAM  Vitals:   01/13/21 1124  BP: 118/71  Pulse: 75  Weight: 139 lb (63 kg)  Height: 5' 6.75" (1.695 m)    Body mass index is 21.93 kg/m.  Generalized: Well developed, in no acute distress  Chest: Lungs clear to auscultation bilaterally  Neurological examination  Mentation: Alert oriented to time, place, history taking. Follows all commands speech and language fluent Cranial nerve II-XII: Extraocular movements were full, visual field were full on confrontational test Head turning and shoulder shrug  were normal and symmetric. Motor: The motor testing reveals 5 over 5 strength of all 4 extremities. Good symmetric motor tone is noted throughout.  Sensory: Sensory testing is intact to soft touch on all 4 extremities. No evidence of extinction is noted.  Gait and station: Gait is normal.    DIAGNOSTIC DATA (LABS, IMAGING, TESTING) - I reviewed patient records, labs, notes, testing and imaging myself where available.  Lab Results  Component Value Date   WBC 6.8 05/21/2020   HGB 14.6 05/21/2020   HCT 43.0 05/21/2020   MCV 88.9 05/21/2020   PLT 123.0 (L)  05/21/2020      Component Value Date/Time   NA 139 05/21/2020 1358   K 4.3 05/21/2020 1358   CL 105 05/21/2020 1358   CO2 28 05/21/2020 1358   GLUCOSE 121 (H) 05/21/2020 1358   BUN 19 05/21/2020 1358   CREATININE 1.29 (H) 05/21/2020 1358   CALCIUM 9.9 05/21/2020 1358   PROT 7.2 05/21/2020 1358   ALBUMIN 4.7 05/21/2020 1358  AST 60 (H) 05/21/2020 1358   ALT 76 (H) 05/21/2020 1358   ALKPHOS 78 05/21/2020 1358   BILITOT 0.6 05/21/2020 1358   GFRNONAA 46 (L) 09/12/2016 0237   GFRAA 53 (L) 09/12/2016 0237      ASSESSMENT AND PLAN 75 y.o. year old female  has a past medical history of Arthritis, GERD (gastroesophageal reflux disease), HLD (hyperlipidemia), Hyperthyroidism, Sleep apnea, and Thrombocytopenia ([place]). here with:  OSA on CPAP  Compliance excellent Encouraged patient to continue using CPAP >4 hours per night Order placed for DME to adjust heated tubing settings.  Follow-up in 1 year or sooner if needed   Ward Givens, MSN, NP-C 01/12/2021, 3:26 PM [facility] [address], [place], [place] [address] [phone]

## 2021-01-13 ENCOUNTER — Encounter: Payer: Self-pay | Admitting: Adult Health

## 2021-01-13 ENCOUNTER — Ambulatory Visit: Payer: PPO | Admitting: Adult Health

## 2021-01-13 VITALS — BP 118/71 | HR 75 | Ht 66.75 in | Wt 139.0 lb

## 2021-01-13 DIAGNOSIS — G4733 Obstructive sleep apnea (adult) (pediatric): Secondary | ICD-10-CM | POA: Diagnosis not present

## 2021-01-13 DIAGNOSIS — Z9989 Dependence on other enabling machines and devices: Secondary | ICD-10-CM

## 2021-01-14 NOTE — Progress Notes (Addendum)
CM sent [employer] change humidity so pt can adjust and tunr off heated tubing feature. SY Denyse Amass, RN got it! Thanks!

## 2021-01-15 DIAGNOSIS — R072 Precordial pain: Secondary | ICD-10-CM | POA: Diagnosis not present

## 2021-01-15 DIAGNOSIS — E0590 Thyrotoxicosis, unspecified without thyrotoxic crisis or storm: Secondary | ICD-10-CM | POA: Diagnosis not present

## 2021-01-15 DIAGNOSIS — M5416 Radiculopathy, lumbar region: Secondary | ICD-10-CM | POA: Diagnosis not present

## 2021-01-15 DIAGNOSIS — G4733 Obstructive sleep apnea (adult) (pediatric): Secondary | ICD-10-CM | POA: Diagnosis not present

## 2021-01-15 DIAGNOSIS — R82998 Other abnormal findings in urine: Secondary | ICD-10-CM | POA: Diagnosis not present

## 2021-01-15 DIAGNOSIS — H9190 Unspecified hearing loss, unspecified ear: Secondary | ICD-10-CM | POA: Diagnosis not present

## 2021-01-15 DIAGNOSIS — K219 Gastro-esophageal reflux disease without esophagitis: Secondary | ICD-10-CM | POA: Diagnosis not present

## 2021-01-15 DIAGNOSIS — E785 Hyperlipidemia, unspecified: Secondary | ICD-10-CM | POA: Diagnosis not present

## 2021-01-15 DIAGNOSIS — Z1339 Encounter for screening examination for other mental health and behavioral disorders: Secondary | ICD-10-CM | POA: Diagnosis not present

## 2021-01-15 DIAGNOSIS — N1832 Chronic kidney disease, stage 3b: Secondary | ICD-10-CM | POA: Diagnosis not present

## 2021-01-15 DIAGNOSIS — K5903 Drug induced constipation: Secondary | ICD-10-CM | POA: Diagnosis not present

## 2021-01-15 DIAGNOSIS — Z1331 Encounter for screening for depression: Secondary | ICD-10-CM | POA: Diagnosis not present

## 2021-01-15 DIAGNOSIS — Z0000 Encounter for general adult medical examination without abnormal findings: Secondary | ICD-10-CM | POA: Diagnosis not present

## 2021-01-20 ENCOUNTER — Other Ambulatory Visit: Payer: Self-pay

## 2021-01-20 ENCOUNTER — Encounter: Payer: Self-pay | Admitting: Cardiology

## 2021-01-20 ENCOUNTER — Ambulatory Visit: Payer: PPO | Admitting: Cardiology

## 2021-01-20 VITALS — BP 117/78 | HR 80 | Temp 97.9°F | Ht 67.0 in | Wt 139.0 lb

## 2021-01-20 DIAGNOSIS — R072 Precordial pain: Secondary | ICD-10-CM

## 2021-01-20 NOTE — Progress Notes (Signed)
Patient referred by Leanna Battles, MD for precordial pain  Subjective:   Heather Kirby, female    DOB: [birth year], 75 y.o.   MRN: [id number]   Chief Complaint  Patient presents with   Chest Pain   Coronary Artery Disease     HPI  75 y.o. Caucasian female with hypertension, stable thrombocytopenia, referred for evaluation of precordial pain.  Patient has experienced lower retrosternal, epigastric pain at rest, sometimes circles around her waist. At times, it is reproducible on palpation. She denies having any of these symptoms with exertion. She reportedly underwent EGD that did not show any significant abnormalities. Symptoms correlate with the timeframe that she has been taking Aspirin for primary prevention.   Past Medical History:  Diagnosis Date   Arthritis    GERD (gastroesophageal reflux disease)    HLD (hyperlipidemia)    Hyperthyroidism    Sleep apnea    Thrombocytopenia ([place])      Past Surgical History:  Procedure Laterality Date   CATARACT EXTRACTION W/ INTRAOCULAR LENS  IMPLANT, BILATERAL     right 8/18 and left was 9/18   COLONOSCOPY     SHOULDER ARTHROSCOPY Right      Social History   Tobacco Use  Smoking Status Never  Smokeless Tobacco Never    Social History   Substance and Sexual Activity  Alcohol Use Yes   Comment: occasional wine     Family History  Problem Relation Age of Onset   Heart failure Mother    Hypertension Mother    Diabetes Father    Heart disease Father    Breast cancer Sister    Heart disease Sister    Hiatal hernia Maternal Grandmother    Heart disease Maternal Aunt    Stomach cancer Neg Hx    Rectal cancer Neg Hx    Esophageal cancer Neg Hx    Colon cancer Neg Hx      Current Outpatient Medications on File Prior to Visit  Medication Sig Dispense Refill   Alum Hydroxide-Mag Carbonate (GAVISCON PO) Take 1 tablet by mouth as needed.     aspirin EC 81 MG tablet Take 81 mg by mouth daily.     Calcium  Carbonate-Vit D-Min (CALCIUM 1200 PO) Take by mouth.     diphenhydramine-acetaminophen (TYLENOL PM) 25-500 MG TABS tablet Take 1 tablet by mouth at bedtime.     hyoscyamine (LEVSIN SL) 0.125 MG SL tablet 1-2 sublingually every 4 hours as need for pain (Patient not taking: Reported on 01/13/2021) 60 tablet 1   methimazole (TAPAZOLE) 5 MG tablet Take 2.5 mg by mouth 2 (two) times daily.     naloxegol oxalate (MOVANTIK) 25 MG TABS tablet Movantik 25 mg tablet  TAKE 1 TABLET BY MOUTH ONCE DAILY IN THE MORNING (Patient not taking: Reported on 01/13/2021)     omeprazole (PRILOSEC) 20 MG capsule Take 20 mg by mouth daily.     ondansetron (ZOFRAN) 4 MG tablet Take 1 tablet (4 mg total) by mouth every 8 (eight) hours as needed for nausea or vomiting. 20 tablet 0   simvastatin (ZOCOR) 40 MG tablet Take 40 mg by mouth daily.     traMADol (ULTRAM) 50 MG tablet Take 50 mg by mouth 3 (three) times daily as needed for moderate pain.     No current facility-administered medications on file prior to visit.    Cardiovascular and other pertinent studies:  EKG 01/20/2021: Sinus rhythm 75 bpm Normal EKG   Recent labs: 01/12/2021:  Glucose 90, BUN/Cr 15/1.3. EGFR 39. Na/K 141/4.6. Rest of the CMP normal H/H 15/44. MCV 92. Platelets 122 HbA1C N/A Chol 143, TG 113, HDL 51, LDL 69 TSH 11 high    Review of Systems  Cardiovascular:  Positive for chest pain. Negative for dyspnea on exertion, leg swelling, palpitations and syncope.        Vitals:   01/20/21 1105  BP: 117/78  Pulse: 80  Temp: 97.9 F (36.6 C)  SpO2: 96%     Body mass index is 21.77 kg/m. Filed Weights   01/20/21 1105  Weight: 139 lb (63 kg)     Objective:   Physical Exam Vitals and nursing note reviewed.  Constitutional:      General: She is not in acute distress. Neck:     Vascular: No JVD.  Cardiovascular:     Rate and Rhythm: Normal rate and regular rhythm.     Heart sounds: Normal heart sounds. No murmur  heard. Pulmonary:     Effort: Pulmonary effort is normal.     Breath sounds: Normal breath sounds. No wheezing or rales.  Musculoskeletal:     Right lower leg: No edema.     Left lower leg: No edema.        Assessment & Recommendations:   75 y.o. Caucasian female with hypertension, stable thrombocytopenia, referred for evaluation of precordial pain.  Chest pain: Unlikely to be angina. I wonder if this is related to Aspirin use. I have asked her to hold Aspirin for next few weeks. If symptoms do not improve, will then consider ischemia testing, although suspicion remains low.   Thank you for referring the patient to [place]. Please feel free to contact with any questions.   Nigel Mormon, MD Pager: [phone] Office: [phone]

## 2021-01-21 ENCOUNTER — Encounter: Payer: Self-pay | Admitting: Cardiology

## 2021-02-02 DIAGNOSIS — N390 Urinary tract infection, site not specified: Secondary | ICD-10-CM | POA: Diagnosis not present

## 2021-02-02 DIAGNOSIS — E0590 Thyrotoxicosis, unspecified without thyrotoxic crisis or storm: Secondary | ICD-10-CM | POA: Diagnosis not present

## 2021-02-02 DIAGNOSIS — N1832 Chronic kidney disease, stage 3b: Secondary | ICD-10-CM | POA: Diagnosis not present

## 2021-02-16 NOTE — Progress Notes (Signed)
Patient referred by Leanna Battles, MD for precordial pain  Subjective:   Heather Kirby, female    DOB: 1945/10/09, 75 y.o.   MRN: [id number]   Chief Complaint  Patient presents with   Precordial pain   Follow-up    4 week     HPI  75 y.o. Caucasian female with hypertension, stable thrombocytopenia, referred for evaluation of precordial pain.  Patient recently returned from a trip to.  On the trip, she walked up hills at 8000 feet without any significant complaints of exertional chest pain.  However, she continues to have left-sided and central pain which is unrelated to exertion.  Stopping aspirin has made some improvement.  She is currently taking omeprazole 40 mg daily.  Of note, her EGD did not show any significant abnormalities in the past.   Current Outpatient Medications on File Prior to Visit  Medication Sig Dispense Refill   Alum Hydroxide-Mag Carbonate (GAVISCON PO) Take 1 tablet by mouth as needed.     aspirin-acetaminophen-caffeine (EXCEDRIN MIGRAINE) 250-250-65 MG tablet Take 1 tablet by mouth every 6 (six) hours as needed for headache.     Calcium Carbonate-Vit D-Min (CALCIUM 1200 PO) Take by mouth.     diphenhydramine-acetaminophen (TYLENOL PM) 25-500 MG TABS tablet Take 1 tablet by mouth at bedtime.     hyoscyamine (LEVSIN SL) 0.125 MG SL tablet 1-2 sublingually every 4 hours as need for pain 60 tablet 1   methimazole (TAPAZOLE) 5 MG tablet Take 2.5 mg by mouth 2 (two) times daily.     naloxegol oxalate (MOVANTIK) 25 MG TABS tablet      omeprazole (PRILOSEC) 20 MG capsule Take 20 mg by mouth daily.     ondansetron (ZOFRAN) 4 MG tablet Take 1 tablet (4 mg total) by mouth every 8 (eight) hours as needed for nausea or vomiting. 20 tablet 0   simvastatin (ZOCOR) 40 MG tablet Take 40 mg by mouth daily.     traMADol (ULTRAM) 50 MG tablet Take 50 mg by mouth 3 (three) times daily as needed for moderate pain.     No current facility-administered medications on file  prior to visit.    Cardiovascular and other pertinent studies:  EKG 01/20/2021: Sinus rhythm 75 bpm Normal EKG   Recent labs: 01/12/2021: Glucose 90, BUN/Cr 15/1.3. EGFR 39. Na/K 141/4.6. Rest of the CMP normal H/H 15/44. MCV 92. Platelets 122 HbA1C N/A Chol 143, TG 113, HDL 51, LDL 69 TSH 11 high    Review of Systems  Cardiovascular:  Positive for chest pain. Negative for dyspnea on exertion, leg swelling, palpitations and syncope.        Vitals:   02/17/21 1136  BP: 125/63  Pulse: 74  Resp: 16  Temp: 98 F (36.7 C)  SpO2: 98%     Body mass index is 21.77 kg/m. Filed Weights   02/17/21 1136  Weight: 139 lb (63 kg)     Objective:   Physical Exam Vitals and nursing note reviewed.  Constitutional:      General: She is not in acute distress. Neck:     Vascular: No JVD.  Cardiovascular:     Rate and Rhythm: Normal rate and regular rhythm.     Heart sounds: Normal heart sounds. No murmur heard. Pulmonary:     Effort: Pulmonary effort is normal.     Breath sounds: Normal breath sounds. No wheezing or rales.  Musculoskeletal:     Right lower leg: No edema.  Left lower leg: No edema.        Assessment & Recommendations:   75 y.o. Caucasian female with hypertension, stable thrombocytopenia, referred for evaluation of precordial pain.  Chest pain: Angina remains unlikely.  I have encouraged the patient to use Tylenol, as she has had side effects with ibuprofen in the past.  She can also use local heat or ice application.  She has had normal mammograms routinely, with next mammogram planned in early 2023.  I think it is reasonable to wait for her scheduled date unless she feels any breast lump.  Should her symptoms not relieved with above means in next 4 weeks, could consider exercises treadmill stress test to close the loop.  Follow-up as needed   Nigel Mormon, MD Pager: [phone] Office: [phone]

## 2021-02-17 ENCOUNTER — Encounter: Payer: Self-pay | Admitting: Cardiology

## 2021-02-17 ENCOUNTER — Other Ambulatory Visit: Payer: Self-pay

## 2021-02-17 ENCOUNTER — Ambulatory Visit: Payer: PPO | Admitting: Cardiology

## 2021-02-17 VITALS — BP 125/63 | HR 74 | Temp 98.0°F | Resp 16 | Ht 67.0 in | Wt 139.0 lb

## 2021-02-17 DIAGNOSIS — R072 Precordial pain: Secondary | ICD-10-CM | POA: Diagnosis not present

## 2021-06-02 DIAGNOSIS — Z08 Encounter for follow-up examination after completed treatment for malignant neoplasm: Secondary | ICD-10-CM | POA: Diagnosis not present

## 2021-06-02 DIAGNOSIS — Z85828 Personal history of other malignant neoplasm of skin: Secondary | ICD-10-CM | POA: Diagnosis not present

## 2021-06-02 DIAGNOSIS — L814 Other melanin hyperpigmentation: Secondary | ICD-10-CM | POA: Diagnosis not present

## 2021-06-02 DIAGNOSIS — L821 Other seborrheic keratosis: Secondary | ICD-10-CM | POA: Diagnosis not present

## 2021-06-02 DIAGNOSIS — L570 Actinic keratosis: Secondary | ICD-10-CM | POA: Diagnosis not present

## 2021-06-02 DIAGNOSIS — C44729 Squamous cell carcinoma of skin of left lower limb, including hip: Secondary | ICD-10-CM | POA: Diagnosis not present

## 2021-06-02 DIAGNOSIS — R229 Localized swelling, mass and lump, unspecified: Secondary | ICD-10-CM | POA: Diagnosis not present

## 2021-06-03 DIAGNOSIS — M5416 Radiculopathy, lumbar region: Secondary | ICD-10-CM | POA: Diagnosis not present

## 2021-06-03 DIAGNOSIS — E0590 Thyrotoxicosis, unspecified without thyrotoxic crisis or storm: Secondary | ICD-10-CM | POA: Diagnosis not present

## 2021-06-03 DIAGNOSIS — K219 Gastro-esophageal reflux disease without esophagitis: Secondary | ICD-10-CM | POA: Diagnosis not present

## 2021-06-03 DIAGNOSIS — G4733 Obstructive sleep apnea (adult) (pediatric): Secondary | ICD-10-CM | POA: Diagnosis not present

## 2021-06-03 DIAGNOSIS — K5901 Slow transit constipation: Secondary | ICD-10-CM | POA: Diagnosis not present

## 2021-06-03 DIAGNOSIS — R42 Dizziness and giddiness: Secondary | ICD-10-CM | POA: Diagnosis not present

## 2021-06-23 DIAGNOSIS — E0590 Thyrotoxicosis, unspecified without thyrotoxic crisis or storm: Secondary | ICD-10-CM | POA: Diagnosis not present

## 2021-06-23 DIAGNOSIS — Z8669 Personal history of other diseases of the nervous system and sense organs: Secondary | ICD-10-CM | POA: Insufficient documentation

## 2021-06-23 DIAGNOSIS — M8580 Other specified disorders of bone density and structure, unspecified site: Secondary | ICD-10-CM | POA: Insufficient documentation

## 2021-06-23 DIAGNOSIS — M1990 Unspecified osteoarthritis, unspecified site: Secondary | ICD-10-CM | POA: Insufficient documentation

## 2021-06-23 DIAGNOSIS — Z1231 Encounter for screening mammogram for malignant neoplasm of breast: Secondary | ICD-10-CM | POA: Diagnosis not present

## 2021-06-23 DIAGNOSIS — R29890 Loss of height: Secondary | ICD-10-CM | POA: Diagnosis not present

## 2021-06-23 DIAGNOSIS — M47896 Other spondylosis, lumbar region: Secondary | ICD-10-CM | POA: Diagnosis not present

## 2021-06-23 DIAGNOSIS — Z6822 Body mass index (BMI) 22.0-22.9, adult: Secondary | ICD-10-CM | POA: Diagnosis not present

## 2021-06-23 DIAGNOSIS — N958 Other specified menopausal and perimenopausal disorders: Secondary | ICD-10-CM | POA: Diagnosis not present

## 2021-06-23 DIAGNOSIS — Z124 Encounter for screening for malignant neoplasm of cervix: Secondary | ICD-10-CM | POA: Diagnosis not present

## 2021-07-19 DIAGNOSIS — N870 Mild cervical dysplasia: Secondary | ICD-10-CM | POA: Diagnosis not present

## 2021-07-19 DIAGNOSIS — N879 Dysplasia of cervix uteri, unspecified: Secondary | ICD-10-CM | POA: Diagnosis not present

## 2021-08-12 DIAGNOSIS — N879 Dysplasia of cervix uteri, unspecified: Secondary | ICD-10-CM | POA: Diagnosis not present

## 2021-08-25 DIAGNOSIS — R87616 Satisfactory cervical smear but lacking transformation zone: Secondary | ICD-10-CM | POA: Diagnosis not present

## 2021-08-25 DIAGNOSIS — N879 Dysplasia of cervix uteri, unspecified: Secondary | ICD-10-CM | POA: Diagnosis not present

## 2021-09-14 DIAGNOSIS — R42 Dizziness and giddiness: Secondary | ICD-10-CM | POA: Diagnosis not present

## 2021-09-14 DIAGNOSIS — E0590 Thyrotoxicosis, unspecified without thyrotoxic crisis or storm: Secondary | ICD-10-CM | POA: Diagnosis not present

## 2021-09-14 DIAGNOSIS — M5117 Intervertebral disc disorders with radiculopathy, lumbosacral region: Secondary | ICD-10-CM | POA: Diagnosis not present

## 2021-09-14 DIAGNOSIS — G4733 Obstructive sleep apnea (adult) (pediatric): Secondary | ICD-10-CM | POA: Diagnosis not present

## 2021-09-14 DIAGNOSIS — E785 Hyperlipidemia, unspecified: Secondary | ICD-10-CM | POA: Diagnosis not present

## 2021-09-14 DIAGNOSIS — K5901 Slow transit constipation: Secondary | ICD-10-CM | POA: Diagnosis not present

## 2021-09-14 DIAGNOSIS — R1310 Dysphagia, unspecified: Secondary | ICD-10-CM | POA: Diagnosis not present

## 2021-10-04 DIAGNOSIS — R140 Abdominal distension (gaseous): Secondary | ICD-10-CM | POA: Diagnosis not present

## 2021-10-04 DIAGNOSIS — K219 Gastro-esophageal reflux disease without esophagitis: Secondary | ICD-10-CM | POA: Diagnosis not present

## 2021-11-30 DIAGNOSIS — E0590 Thyrotoxicosis, unspecified without thyrotoxic crisis or storm: Secondary | ICD-10-CM | POA: Diagnosis not present

## 2021-11-30 DIAGNOSIS — E785 Hyperlipidemia, unspecified: Secondary | ICD-10-CM | POA: Diagnosis not present

## 2022-01-19 ENCOUNTER — Ambulatory Visit: Payer: PPO | Admitting: Adult Health

## 2022-01-19 ENCOUNTER — Encounter: Payer: Self-pay | Admitting: Adult Health

## 2022-01-19 VITALS — BP 109/65 | HR 70 | Ht 66.5 in | Wt 138.4 lb

## 2022-01-19 DIAGNOSIS — G4733 Obstructive sleep apnea (adult) (pediatric): Secondary | ICD-10-CM

## 2022-01-19 DIAGNOSIS — Z9989 Dependence on other enabling machines and devices: Secondary | ICD-10-CM | POA: Diagnosis not present

## 2022-01-19 NOTE — Progress Notes (Addendum)
Order for pressure change sent to [facility]. The team has confirmed receipt of order.

## 2022-01-19 NOTE — Progress Notes (Signed)
PATIENT: Heather Kirby DOB: 1946/03/27  REASON FOR VISIT: follow up HISTORY FROM: patient Chief Complaint  Patient presents with   Follow-up    Rm 8, alone.  CPAP.  Using, feels fine when using.      HISTORY OF PRESENT ILLNESS: Today 01/19/22:  Heather Kirby is a 76 year old female with a history of obstructive sleep apnea on CPAP.  She returns today for follow-up. She reports that she tries to use the machine greater than 6 hours every night.  She states that she does sleep better with the machine although she is still fatigued during the day but she contributes this to her age.  She also states that she has a lot of gas.  She states that she went without using machine for 4 days and she did not have any issues with gas.  She is wondering if it is the pressure and if she is swallowing air.  Her current pressure is set at 4-20 with a EPR of 2.    01/13/21:  Heather Kirby is a 76 y.o. female being followed here for obstructive sleep apnea on CPAP. She is here for a follow up. Patient didn't use her CPAP during travel over the last month. Her download between 08/21/20-11/18/20 shows a compliance of 100% with and average usage of 6 hours and 52 minutes per night. She uses her machine greater than 4 hours for 98.9% of the days. Her AHI is 5.2 on an auto pressure setting of 4 cmH2O to 20 cmH2O. Patient endorses that her memory has improved slightly since she started using her new machine which had begun when she stopped using her old machine that had been recalled. She reports that's she is sleeping much better with her new machine.  Patient reports 6 episodes of chest pain over the last year, in which she has seen her PCP and has been referred to GI for evaluation. All test were unremarkable per the patient. She states she is seeing her PCP on Friday and will bring this up with him and I informed the patient to keep the office updated.   01/09/20: Heather Kirby is a 76 year old female with a history o for  like.  Still with a lot of energy.  At that time 42 f obstructive sleep apnea on CPAP.  Her download indicates that she use her machine 28 out of 30 days for compliance of 93.3%.  She use her machine greater than 4 hours each night..  Her residual AHI is 5.5 on 5 to 15 cm of water.  She states that she stopped using her CPAP July 15.  She states that this is due to the recall.  She has registered her machine with [facility].  But she does not feel comfortable using her current CPAP.  She returns today for an evaluation.   REVIEW OF SYSTEMS: Out of a complete 14 system review of symptoms, the patient complains only of the following symptoms, and all other reviewed systems are negative.  ESS 3  ALLERGIES: Allergies  Allergen Reactions   Alendronate Sodium     Other reaction(s): GI upset   Gabapentin     Other reaction(s): Severe Dry Mouth    HOME MEDICATIONS: Outpatient Medications Prior to Visit  Medication Sig Dispense Refill   Alum Hydroxide-Mag Carbonate (GAVISCON PO) Take 1 tablet by mouth as needed.     aspirin-acetaminophen-caffeine (EXCEDRIN MIGRAINE) 250-250-65 MG tablet Take 1 tablet by mouth every 6 (six) hours as needed for headache.  Calcium Carbonate-Vit D-Min (CALCIUM 1200 PO) Take by mouth.     diphenhydramine-acetaminophen (TYLENOL PM) 25-500 MG TABS tablet Take 1 tablet by mouth at bedtime.     hyoscyamine (LEVSIN SL) 0.125 MG SL tablet 1-2 sublingually every 4 hours as need for pain 60 tablet 1   methimazole (TAPAZOLE) 5 MG tablet Take 2.5 mg by mouth 2 (two) times daily.     naloxegol oxalate (MOVANTIK) 25 MG TABS tablet      omeprazole (PRILOSEC) 20 MG capsule Take 20 mg by mouth daily.     ondansetron (ZOFRAN) 4 MG tablet Take 1 tablet (4 mg total) by mouth every 8 (eight) hours as needed for nausea or vomiting. 20 tablet 0   simvastatin (ZOCOR) 40 MG tablet Take 40 mg by mouth daily.     traMADol (ULTRAM) 50 MG tablet Take 50 mg by mouth 3 (three) times daily as  needed for moderate pain.     No facility-administered medications prior to visit.    PAST MEDICAL HISTORY: Past Medical History:  Diagnosis Date   Arthritis    GERD (gastroesophageal reflux disease)    HLD (hyperlipidemia)    Hyperthyroidism    Sleep apnea    Thrombocytopenia ([place])     PAST SURGICAL HISTORY: Past Surgical History:  Procedure Laterality Date   CATARACT EXTRACTION W/ INTRAOCULAR LENS  IMPLANT, BILATERAL     right 8/18 and left was 9/18   COLONOSCOPY     SHOULDER ARTHROSCOPY Right     FAMILY HISTORY: Family History  Problem Relation Age of Onset   Heart failure Mother    Hypertension Mother    Diabetes Father    Heart disease Father    Breast cancer Sister    Heart disease Sister    Hiatal hernia Maternal Grandmother    Heart disease Maternal Aunt    Stomach cancer Neg Hx    Rectal cancer Neg Hx    Esophageal cancer Neg Hx    Colon cancer Neg Hx     SOCIAL HISTORY: Social History   Socioeconomic History   Marital status: Married    Spouse name: Not on file   Number of children: 1   Years of education: Not on file   Highest education level: Not on file  Occupational History   Occupation: retired  Tobacco Use   Smoking status: Never   Smokeless tobacco: Never  Vaping Use   Vaping Use: Never used  Substance and Sexual Activity   Alcohol use: Yes    Comment: occasional wine   Drug use: No   Sexual activity: Not on file  Other Topics Concern   Not on file  Social History Narrative   Lives at home with husband    Right handed   Caffeine: only what is in some chocolate, no coffee or caffeinated sodas    Social Determinants of Radio broadcast assistant Strain: Not on file  Food Insecurity: Not on file  Transportation Needs: Not on file  Physical Activity: Not on file  Stress: Not on file  Social Connections: Not on file  Intimate Partner Violence: Not on file      PHYSICAL EXAM  Vitals:   01/19/22 1136  BP: 109/65   Pulse: 70  Weight: 138 lb 6.4 oz (62.8 kg)  Height: 5' 6.5" (1.689 m)     Body mass index is 22 kg/m.  Generalized: Well developed, in no acute distress  Chest: Lungs clear to auscultation bilaterally  Neurological examination  Mentation: Alert oriented to time, place, history taking. Follows all commands speech and language fluent Cranial nerve II-XII: Extraocular movements were full, visual field were full on confrontational test Head turning and shoulder shrug  were normal and symmetric. Motor: The motor testing reveals 5 over 5 strength of all 4 extremities. Good symmetric motor tone is noted throughout.  Sensory: Sensory testing is intact to soft touch on all 4 extremities. No evidence of extinction is noted.  Gait and station: Gait is normal.    DIAGNOSTIC DATA (LABS, IMAGING, TESTING) - I reviewed patient records, labs, notes, testing and imaging myself where available.  Lab Results  Component Value Date   WBC 6.8 05/21/2020   HGB 14.6 05/21/2020   HCT 43.0 05/21/2020   MCV 88.9 05/21/2020   PLT 123.0 (L) 05/21/2020      Component Value Date/Time   NA 139 05/21/2020 1358   K 4.3 05/21/2020 1358   CL 105 05/21/2020 1358   CO2 28 05/21/2020 1358   GLUCOSE 121 (H) 05/21/2020 1358   BUN 19 05/21/2020 1358   CREATININE 1.29 (H) 05/21/2020 1358   CALCIUM 9.9 05/21/2020 1358   PROT 7.2 05/21/2020 1358   ALBUMIN 4.7 05/21/2020 1358   AST 60 (H) 05/21/2020 1358   ALT 76 (H) 05/21/2020 1358   ALKPHOS 78 05/21/2020 1358   BILITOT 0.6 05/21/2020 1358   GFRNONAA 46 (L) 09/12/2016 0237   GFRAA 53 (L) 09/12/2016 0237      ASSESSMENT AND PLAN 76 y.o. year old female  has a past medical history of Arthritis, GERD (gastroesophageal reflux disease), HLD (hyperlipidemia), Hyperthyroidism, Sleep apnea, and Thrombocytopenia ([place]). here with:  OSA on CPAP  Compliance excellent Encouraged patient to continue using CPAP >4 hours per night Order placed for DME to adjust  heated tubing settings.  Follow-up in 1 year or sooner if needed   Ward Givens, MSN, NP-C 01/19/2022, 11:14 AM [facility] [address] [place], [place] [address] [phone]

## 2022-01-20 DIAGNOSIS — H9193 Unspecified hearing loss, bilateral: Secondary | ICD-10-CM | POA: Diagnosis not present

## 2022-01-20 DIAGNOSIS — K219 Gastro-esophageal reflux disease without esophagitis: Secondary | ICD-10-CM | POA: Diagnosis not present

## 2022-01-20 DIAGNOSIS — E0590 Thyrotoxicosis, unspecified without thyrotoxic crisis or storm: Secondary | ICD-10-CM | POA: Diagnosis not present

## 2022-01-20 DIAGNOSIS — Z9849 Cataract extraction status, unspecified eye: Secondary | ICD-10-CM | POA: Diagnosis not present

## 2022-01-20 DIAGNOSIS — M1990 Unspecified osteoarthritis, unspecified site: Secondary | ICD-10-CM | POA: Diagnosis not present

## 2022-01-20 DIAGNOSIS — R110 Nausea: Secondary | ICD-10-CM | POA: Diagnosis not present

## 2022-01-20 DIAGNOSIS — E785 Hyperlipidemia, unspecified: Secondary | ICD-10-CM | POA: Diagnosis not present

## 2022-01-20 DIAGNOSIS — M8580 Other specified disorders of bone density and structure, unspecified site: Secondary | ICD-10-CM | POA: Diagnosis not present

## 2022-01-20 DIAGNOSIS — K5900 Constipation, unspecified: Secondary | ICD-10-CM | POA: Diagnosis not present

## 2022-01-20 DIAGNOSIS — G8929 Other chronic pain: Secondary | ICD-10-CM | POA: Diagnosis not present

## 2022-01-20 DIAGNOSIS — G4733 Obstructive sleep apnea (adult) (pediatric): Secondary | ICD-10-CM | POA: Diagnosis not present

## 2022-01-20 DIAGNOSIS — E039 Hypothyroidism, unspecified: Secondary | ICD-10-CM | POA: Diagnosis not present

## 2022-01-24 DIAGNOSIS — L298 Other pruritus: Secondary | ICD-10-CM | POA: Diagnosis not present

## 2022-01-24 DIAGNOSIS — D485 Neoplasm of uncertain behavior of skin: Secondary | ICD-10-CM | POA: Diagnosis not present

## 2022-01-24 DIAGNOSIS — L578 Other skin changes due to chronic exposure to nonionizing radiation: Secondary | ICD-10-CM | POA: Diagnosis not present

## 2022-01-24 DIAGNOSIS — R208 Other disturbances of skin sensation: Secondary | ICD-10-CM | POA: Diagnosis not present

## 2022-01-24 DIAGNOSIS — C44629 Squamous cell carcinoma of skin of left upper limb, including shoulder: Secondary | ICD-10-CM | POA: Diagnosis not present

## 2022-01-24 DIAGNOSIS — L821 Other seborrheic keratosis: Secondary | ICD-10-CM | POA: Diagnosis not present

## 2022-01-24 DIAGNOSIS — L820 Inflamed seborrheic keratosis: Secondary | ICD-10-CM | POA: Diagnosis not present

## 2022-01-24 DIAGNOSIS — L570 Actinic keratosis: Secondary | ICD-10-CM | POA: Diagnosis not present

## 2022-01-24 DIAGNOSIS — L538 Other specified erythematous conditions: Secondary | ICD-10-CM | POA: Diagnosis not present

## 2022-01-24 DIAGNOSIS — Z789 Other specified health status: Secondary | ICD-10-CM | POA: Diagnosis not present

## 2022-01-24 DIAGNOSIS — L814 Other melanin hyperpigmentation: Secondary | ICD-10-CM | POA: Diagnosis not present

## 2022-01-24 DIAGNOSIS — C44619 Basal cell carcinoma of skin of left upper limb, including shoulder: Secondary | ICD-10-CM | POA: Diagnosis not present

## 2022-01-24 DIAGNOSIS — C44719 Basal cell carcinoma of skin of left lower limb, including hip: Secondary | ICD-10-CM | POA: Diagnosis not present

## 2022-01-24 DIAGNOSIS — R229 Localized swelling, mass and lump, unspecified: Secondary | ICD-10-CM | POA: Diagnosis not present

## 2022-01-28 DIAGNOSIS — G4733 Obstructive sleep apnea (adult) (pediatric): Secondary | ICD-10-CM | POA: Diagnosis not present

## 2022-02-15 DIAGNOSIS — Z124 Encounter for screening for malignant neoplasm of cervix: Secondary | ICD-10-CM | POA: Diagnosis not present

## 2022-02-15 DIAGNOSIS — Z1151 Encounter for screening for human papillomavirus (HPV): Secondary | ICD-10-CM | POA: Diagnosis not present

## 2022-02-15 DIAGNOSIS — R87610 Atypical squamous cells of undetermined significance on cytologic smear of cervix (ASC-US): Secondary | ICD-10-CM | POA: Diagnosis not present

## 2022-03-04 DIAGNOSIS — C44619 Basal cell carcinoma of skin of left upper limb, including shoulder: Secondary | ICD-10-CM | POA: Diagnosis not present

## 2022-03-04 DIAGNOSIS — C44629 Squamous cell carcinoma of skin of left upper limb, including shoulder: Secondary | ICD-10-CM | POA: Diagnosis not present

## 2022-03-10 DIAGNOSIS — R7989 Other specified abnormal findings of blood chemistry: Secondary | ICD-10-CM | POA: Diagnosis not present

## 2022-03-10 DIAGNOSIS — E785 Hyperlipidemia, unspecified: Secondary | ICD-10-CM | POA: Diagnosis not present

## 2022-03-10 DIAGNOSIS — Z1212 Encounter for screening for malignant neoplasm of rectum: Secondary | ICD-10-CM | POA: Diagnosis not present

## 2022-03-10 DIAGNOSIS — E0590 Thyrotoxicosis, unspecified without thyrotoxic crisis or storm: Secondary | ICD-10-CM | POA: Diagnosis not present

## 2022-03-10 DIAGNOSIS — N1832 Chronic kidney disease, stage 3b: Secondary | ICD-10-CM | POA: Diagnosis not present

## 2022-03-17 DIAGNOSIS — M5416 Radiculopathy, lumbar region: Secondary | ICD-10-CM | POA: Diagnosis not present

## 2022-03-17 DIAGNOSIS — E785 Hyperlipidemia, unspecified: Secondary | ICD-10-CM | POA: Diagnosis not present

## 2022-03-17 DIAGNOSIS — N1831 Chronic kidney disease, stage 3a: Secondary | ICD-10-CM | POA: Diagnosis not present

## 2022-03-17 DIAGNOSIS — Z23 Encounter for immunization: Secondary | ICD-10-CM | POA: Diagnosis not present

## 2022-03-17 DIAGNOSIS — K5901 Slow transit constipation: Secondary | ICD-10-CM | POA: Diagnosis not present

## 2022-03-17 DIAGNOSIS — G4733 Obstructive sleep apnea (adult) (pediatric): Secondary | ICD-10-CM | POA: Diagnosis not present

## 2022-03-17 DIAGNOSIS — Z0000 Encounter for general adult medical examination without abnormal findings: Secondary | ICD-10-CM | POA: Diagnosis not present

## 2022-03-17 DIAGNOSIS — K219 Gastro-esophageal reflux disease without esophagitis: Secondary | ICD-10-CM | POA: Diagnosis not present

## 2022-03-17 DIAGNOSIS — M8580 Other specified disorders of bone density and structure, unspecified site: Secondary | ICD-10-CM | POA: Diagnosis not present

## 2022-03-17 DIAGNOSIS — R82998 Other abnormal findings in urine: Secondary | ICD-10-CM | POA: Diagnosis not present

## 2022-03-17 DIAGNOSIS — E0590 Thyrotoxicosis, unspecified without thyrotoxic crisis or storm: Secondary | ICD-10-CM | POA: Diagnosis not present

## 2022-03-18 DIAGNOSIS — I872 Venous insufficiency (chronic) (peripheral): Secondary | ICD-10-CM | POA: Diagnosis not present

## 2022-03-18 DIAGNOSIS — C44719 Basal cell carcinoma of skin of left lower limb, including hip: Secondary | ICD-10-CM | POA: Diagnosis not present

## 2022-04-28 DIAGNOSIS — G4733 Obstructive sleep apnea (adult) (pediatric): Secondary | ICD-10-CM | POA: Diagnosis not present

## 2022-05-18 ENCOUNTER — Encounter: Payer: Self-pay | Admitting: Adult Health

## 2022-05-18 DIAGNOSIS — G4733 Obstructive sleep apnea (adult) (pediatric): Secondary | ICD-10-CM

## 2022-05-24 NOTE — Telephone Encounter (Signed)
[place], Chalmers Cater, Lourena Simmonds, RN; [place], Leory Plowman; [place], Lenna Sciara; Nash Shearer; Minus Liberty Received, Thank you!

## 2022-05-30 NOTE — Telephone Encounter (Signed)
Message sent to [facility] to clarify pressure should be 4-12 not 4-16.

## 2022-06-15 NOTE — Addendum Note (Signed)
Addended by: Gildardo Griffes on: 06/15/2022 09:26 AM   Modules accepted: Orders

## 2022-06-15 NOTE — Addendum Note (Signed)
Addended by: Trudie Buckler on: 06/15/2022 09:58 AM   Modules accepted: Orders

## 2022-06-15 NOTE — Telephone Encounter (Signed)
Order for new machine sent to [facility].

## 2022-06-16 NOTE — Telephone Encounter (Signed)
[facility] confirmed receipt of order.  

## 2022-07-12 DIAGNOSIS — Z124 Encounter for screening for malignant neoplasm of cervix: Secondary | ICD-10-CM | POA: Diagnosis not present

## 2022-07-12 DIAGNOSIS — Z1231 Encounter for screening mammogram for malignant neoplasm of breast: Secondary | ICD-10-CM | POA: Diagnosis not present

## 2022-07-12 DIAGNOSIS — Z6822 Body mass index (BMI) 22.0-22.9, adult: Secondary | ICD-10-CM | POA: Diagnosis not present

## 2022-07-22 DIAGNOSIS — G4733 Obstructive sleep apnea (adult) (pediatric): Secondary | ICD-10-CM | POA: Diagnosis not present

## 2022-07-23 ENCOUNTER — Emergency Department (HOSPITAL_COMMUNITY): Payer: PPO

## 2022-07-23 ENCOUNTER — Inpatient Hospital Stay (HOSPITAL_COMMUNITY)
Admission: EM | Admit: 2022-07-23 | Discharge: 2022-07-26 | DRG: 419 | Disposition: A | Discharge status: Home / Self Care | Payer: PPO | Attending: Internal Medicine | Admitting: Internal Medicine

## 2022-07-23 DIAGNOSIS — R1084 Generalized abdominal pain: Secondary | ICD-10-CM | POA: Diagnosis not present

## 2022-07-23 DIAGNOSIS — Z79899 Other long term (current) drug therapy: Secondary | ICD-10-CM | POA: Diagnosis not present

## 2022-07-23 DIAGNOSIS — N289 Disorder of kidney and ureter, unspecified: Secondary | ICD-10-CM | POA: Diagnosis present

## 2022-07-23 DIAGNOSIS — K8020 Calculus of gallbladder without cholecystitis without obstruction: Secondary | ICD-10-CM

## 2022-07-23 DIAGNOSIS — K828 Other specified diseases of gallbladder: Secondary | ICD-10-CM | POA: Diagnosis not present

## 2022-07-23 DIAGNOSIS — Z803 Family history of malignant neoplasm of breast: Secondary | ICD-10-CM

## 2022-07-23 DIAGNOSIS — R1010 Upper abdominal pain, unspecified: Secondary | ICD-10-CM | POA: Diagnosis not present

## 2022-07-23 DIAGNOSIS — N281 Cyst of kidney, acquired: Secondary | ICD-10-CM | POA: Diagnosis not present

## 2022-07-23 DIAGNOSIS — I10 Essential (primary) hypertension: Secondary | ICD-10-CM | POA: Diagnosis present

## 2022-07-23 DIAGNOSIS — R7401 Elevation of levels of liver transaminase levels: Secondary | ICD-10-CM | POA: Diagnosis present

## 2022-07-23 DIAGNOSIS — R1013 Epigastric pain: Secondary | ICD-10-CM | POA: Diagnosis not present

## 2022-07-23 DIAGNOSIS — R030 Elevated blood-pressure reading, without diagnosis of hypertension: Secondary | ICD-10-CM | POA: Diagnosis present

## 2022-07-23 DIAGNOSIS — K8010 Calculus of gallbladder with chronic cholecystitis without obstruction: Principal | ICD-10-CM | POA: Diagnosis present

## 2022-07-23 DIAGNOSIS — E039 Hypothyroidism, unspecified: Secondary | ICD-10-CM | POA: Diagnosis present

## 2022-07-23 DIAGNOSIS — E785 Hyperlipidemia, unspecified: Secondary | ICD-10-CM | POA: Diagnosis present

## 2022-07-23 DIAGNOSIS — Z833 Family history of diabetes mellitus: Secondary | ICD-10-CM

## 2022-07-23 DIAGNOSIS — D72829 Elevated white blood cell count, unspecified: Secondary | ICD-10-CM | POA: Diagnosis present

## 2022-07-23 DIAGNOSIS — G4733 Obstructive sleep apnea (adult) (pediatric): Secondary | ICD-10-CM | POA: Diagnosis present

## 2022-07-23 DIAGNOSIS — Z888 Allergy status to other drugs, medicaments and biological substances status: Secondary | ICD-10-CM | POA: Diagnosis not present

## 2022-07-23 DIAGNOSIS — K219 Gastro-esophageal reflux disease without esophagitis: Secondary | ICD-10-CM | POA: Diagnosis present

## 2022-07-23 DIAGNOSIS — D696 Thrombocytopenia, unspecified: Secondary | ICD-10-CM | POA: Diagnosis present

## 2022-07-23 DIAGNOSIS — Z8249 Family history of ischemic heart disease and other diseases of the circulatory system: Secondary | ICD-10-CM

## 2022-07-23 DIAGNOSIS — E0590 Thyrotoxicosis, unspecified without thyrotoxic crisis or storm: Secondary | ICD-10-CM | POA: Diagnosis present

## 2022-07-23 DIAGNOSIS — K7689 Other specified diseases of liver: Secondary | ICD-10-CM | POA: Diagnosis not present

## 2022-07-23 DIAGNOSIS — R109 Unspecified abdominal pain: Secondary | ICD-10-CM | POA: Diagnosis present

## 2022-07-23 DIAGNOSIS — Z9842 Cataract extraction status, left eye: Secondary | ICD-10-CM

## 2022-07-23 DIAGNOSIS — Z7982 Long term (current) use of aspirin: Secondary | ICD-10-CM | POA: Diagnosis not present

## 2022-07-23 DIAGNOSIS — Z961 Presence of intraocular lens: Secondary | ICD-10-CM | POA: Diagnosis present

## 2022-07-23 DIAGNOSIS — Z9841 Cataract extraction status, right eye: Secondary | ICD-10-CM

## 2022-07-23 DIAGNOSIS — K812 Acute cholecystitis with chronic cholecystitis: Secondary | ICD-10-CM | POA: Diagnosis not present

## 2022-07-23 DIAGNOSIS — M546 Pain in thoracic spine: Secondary | ICD-10-CM | POA: Diagnosis not present

## 2022-07-23 DIAGNOSIS — N2889 Other specified disorders of kidney and ureter: Secondary | ICD-10-CM | POA: Diagnosis not present

## 2022-07-23 DIAGNOSIS — M25511 Pain in right shoulder: Secondary | ICD-10-CM | POA: Diagnosis not present

## 2022-07-23 DIAGNOSIS — K8044 Calculus of bile duct with chronic cholecystitis without obstruction: Secondary | ICD-10-CM | POA: Diagnosis not present

## 2022-07-23 DIAGNOSIS — K819 Cholecystitis, unspecified: Secondary | ICD-10-CM | POA: Diagnosis not present

## 2022-07-23 LAB — COMPREHENSIVE METABOLIC PANEL
ALT: 29 U/L (ref 0–44)
AST: 63 U/L — ABNORMAL HIGH (ref 15–41)
Albumin: 3.9 g/dL (ref 3.5–5.0)
Alkaline Phosphatase: 80 U/L (ref 38–126)
Anion gap: 11 (ref 5–15)
BUN: 20 mg/dL (ref 8–23)
CO2: 23 mmol/L (ref 22–32)
Calcium: 9.6 mg/dL (ref 8.9–10.3)
Chloride: 105 mmol/L (ref 98–111)
Creatinine, Ser: 1.16 mg/dL — ABNORMAL HIGH (ref 0.44–1.00)
GFR, Estimated: 49 mL/min — ABNORMAL LOW (ref >60)
Glucose, Bld: 101 mg/dL — ABNORMAL HIGH (ref 70–99)
Potassium: 4.4 mmol/L (ref 3.5–5.1)
Sodium: 139 mmol/L (ref 135–145)
Total Bilirubin: 0.8 mg/dL (ref 0.3–1.2)
Total Protein: 6.4 g/dL — ABNORMAL LOW (ref 6.5–8.1)

## 2022-07-23 LAB — CBC WITH DIFFERENTIAL/PLATELET
Abs Immature Granulocytes: 0.06 10*3/uL (ref 0.00–0.07)
Basophils Absolute: 0 10*3/uL (ref 0.0–0.1)
Basophils Relative: 0 %
Eosinophils Absolute: 0.2 10*3/uL (ref 0.0–0.5)
Eosinophils Relative: 2 %
HCT: 41.8 % (ref 36.0–46.0)
Hemoglobin: 14.2 g/dL (ref 12.0–15.0)
Immature Granulocytes: 1 %
Lymphocytes Relative: 7 %
Lymphs Abs: 0.9 10*3/uL (ref 0.7–4.0)
MCH: 30.2 pg (ref 26.0–34.0)
MCHC: 34 g/dL (ref 30.0–36.0)
MCV: 88.9 fL (ref 80.0–100.0)
Monocytes Absolute: 0.6 10*3/uL (ref 0.1–1.0)
Monocytes Relative: 5 %
Neutro Abs: 10.9 10*3/uL — ABNORMAL HIGH (ref 1.7–7.7)
Neutrophils Relative %: 85 %
Platelets: 109 10*3/uL — ABNORMAL LOW (ref 150–400)
RBC: 4.7 MIL/uL (ref 3.87–5.11)
RDW: 13.2 % (ref 11.5–15.5)
WBC: 12.7 10*3/uL — ABNORMAL HIGH (ref 4.0–10.5)
nRBC: 0 % (ref 0.0–0.2)

## 2022-07-23 LAB — LIPASE, BLOOD: Lipase: 37 U/L (ref 11–51)

## 2022-07-23 LAB — TROPONIN I (HIGH SENSITIVITY): Troponin I (High Sensitivity): 3 ng/L (ref <18)

## 2022-07-23 MED ORDER — ASPIRIN-ACETAMINOPHEN-CAFFEINE 250-250-65 MG PO TABS
1.0000 | ORAL_TABLET | Freq: Four times a day (QID) | ORAL | Status: DC | PRN
  Administered 2022-07-24 – 2022-07-26 (×2): 1 via ORAL
  Filled 2022-07-23 (×3): qty 1

## 2022-07-23 MED ORDER — ACETAMINOPHEN 650 MG RE SUPP: 650.0000 mg | Freq: Four times a day (QID) | RECTAL | Status: DC | PRN

## 2022-07-23 MED ORDER — ONDANSETRON HCL 4 MG/2ML IJ SOLN
4.0000 mg | Freq: Once | INTRAMUSCULAR | Status: AC
  Administered 2022-07-23: 4 mg via INTRAVENOUS
  Filled 2022-07-23: qty 2

## 2022-07-23 MED ORDER — ONDANSETRON HCL 4 MG/2ML IJ SOLN
INTRAMUSCULAR | Status: AC
  Filled 2022-07-23: qty 2

## 2022-07-23 MED ORDER — SIMVASTATIN 20 MG PO TABS
40.0000 mg | ORAL_TABLET | Freq: Every day | ORAL | Status: DC
  Administered 2022-07-23 – 2022-07-25 (×3): 40 mg via ORAL
  Filled 2022-07-23 (×3): qty 2

## 2022-07-23 MED ORDER — ACETAMINOPHEN 325 MG PO TABS
650.0000 mg | ORAL_TABLET | Freq: Four times a day (QID) | ORAL | Status: DC | PRN
  Administered 2022-07-23 – 2022-07-24 (×2): 650 mg via ORAL
  Filled 2022-07-23 (×2): qty 2

## 2022-07-23 MED ORDER — IOHEXOL 350 MG/ML SOLN
75.0000 mL | Freq: Once | INTRAVENOUS | Status: AC | PRN
  Administered 2022-07-23: 75 mL via INTRAVENOUS

## 2022-07-23 MED ORDER — HEPARIN SODIUM (PORCINE) 5000 UNIT/ML IJ SOLN
5000.0000 [IU] | Freq: Two times a day (BID) | INTRAMUSCULAR | Status: DC
  Administered 2022-07-23 – 2022-07-24 (×3): 5000 [IU] via SUBCUTANEOUS
  Filled 2022-07-23 (×3): qty 1

## 2022-07-23 MED ORDER — ONDANSETRON HCL 4 MG/2ML IJ SOLN: 4.0000 mg | Freq: Four times a day (QID) | INTRAMUSCULAR | Status: DC | PRN

## 2022-07-23 MED ORDER — PANTOPRAZOLE SODIUM 40 MG PO TBEC
40.0000 mg | DELAYED_RELEASE_TABLET | Freq: Every day | ORAL | Status: DC
  Administered 2022-07-23 – 2022-07-26 (×4): 40 mg via ORAL
  Filled 2022-07-23 (×4): qty 1

## 2022-07-23 MED ORDER — FENTANYL CITRATE PF 50 MCG/ML IJ SOSY
50.0000 ug | PREFILLED_SYRINGE | Freq: Once | INTRAMUSCULAR | Status: AC
  Administered 2022-07-23: 50 ug via INTRAVENOUS
  Filled 2022-07-23: qty 1

## 2022-07-23 MED ORDER — METHIMAZOLE 5 MG PO TABS
2.5000 mg | ORAL_TABLET | Freq: Two times a day (BID) | ORAL | Status: DC
  Administered 2022-07-23 – 2022-07-26 (×6): 2.5 mg via ORAL
  Filled 2022-07-23 (×11): qty 1

## 2022-07-23 NOTE — ED Notes (Signed)
Heather Kirby, supervisor, Nuc med, voiced that pt will be scanned in the morning d/t recent pain med administration. Pt is to remain npo and without pain meds until scanned in the morning.

## 2022-07-23 NOTE — ED Provider Notes (Signed)
I assumed care from Dr. Gustavus Messing at 3:30 PM.  Patient initially arrived due to worsening pain in her epigastric area that worsened around 7 PM last night and has been waxing and radiating.  Pain initially resolved upon arrival to the emergency room and then started again.  She has had upper endoscopy and ultrasound in the past for this pain which were unremarkable.  Patient's labs with a mild leukocytosis of 12.7, otherwise AST of 63 but the rest of LFTs are within normal limits.  Lipase within normal limits.  Patient had a CT which was unremarkable.  Patient did receive some pain medicine due to significant discomfort and was then told that she cannot have a HIDA scan until tomorrow.  Dr. Amado Coe with surgery came and saw the patient recommended doing an ultrasound to evaluate for gallstones or other acute gallbladder pathology.  Patient's ultrasound today shows no acute pathology.  On repeat evaluation patient is still having abdominal pain and at this time we will plan on admitting to the hospitalist for a HIDA scan in the morning and surgery will follow and may in the future do a laparoscopic cholecystectomy due to her ongoing issues.  This was discussed with the patient.  She and her husband are comfortable with this plan.  Currently pain is present but tolerable.   Blanchie Dessert, MD 07/23/22 1600

## 2022-07-23 NOTE — Consult Note (Addendum)
Surgical Evaluation Requesting provider: Dr. Sherry Ruffing  Chief Complaint: abdominal pain  HPI: Very pleasant 77 year old woman with the below medical history who presents to the emergency department for evaluation of upper abdominal pain.  She has been having this intermittently for 3 years, and states she has had 7 or 8 episodes.  This has been the worst one.  She and her husband had a whopper and iced mocha for lunch and then later on went to [employer] and had a large cranberry muffin.  About 30 minutes after the later she developed intense epigastric pain which spans across the entire upper abdomen and radiates into the back.  This is a burning, constant sensation.  No associated nausea or emesis.  It seemed to subside a little bit after drinking some flat soda and belching, and she was able to fall asleep.  However she awoke at 2 AM with resurgence of the pain and after about an hour of being able to find a comfortable position she presented to the emergency department.  Here she has undergone a CT scan of the abdomen pelvis which is negative for any acute abnormality.  It does note mild gallbladder distention, but no inflammatory change or gallstones. She has undergone endoscopy for this with Dr. Henrene Pastor in January 2022, this was normal with no hiatal hernia, esophagitis, gastritis, ulcer, etc.  Also had an ultrasound around that time which was negative for gallstones.  She has been evaluated by cardiology in October 2022 and her symptoms were determined not to be cardiac. She is a bit more comfortable now after receiving a dose of fentanyl, which was about 4 hours prior to my exam. Denies any previous abdominal surgery.  Allergies  Allergen Reactions   Alendronate Sodium     Other reaction(s): GI upset   Gabapentin     Other reaction(s): Severe Dry Mouth    Past Medical History:  Diagnosis Date   Arthritis    GERD (gastroesophageal reflux disease)    HLD (hyperlipidemia)    Hyperthyroidism     Sleep apnea    Thrombocytopenia (HCC)     Past Surgical History:  Procedure Laterality Date   CATARACT EXTRACTION W/ INTRAOCULAR LENS  IMPLANT, BILATERAL     right 8/18 and left was 9/18   CERVICAL ABLATION     irregular pap smear  11/2021   COLONOSCOPY     SHOULDER ARTHROSCOPY Right     Family History  Problem Relation Age of Onset   Heart failure Mother    Hypertension Mother    Diabetes Father    Heart disease Father    Breast cancer Sister    Heart disease Sister    Hiatal hernia Maternal Grandmother    Heart disease Maternal Aunt    Stomach cancer Neg Hx    Rectal cancer Neg Hx    Esophageal cancer Neg Hx    Colon cancer Neg Hx     Social History   Socioeconomic History   Marital status: Married    Spouse name: Not on file   Number of children: 1   Years of education: Not on file   Highest education level: Not on file  Occupational History   Occupation: retired  Tobacco Use   Smoking status: Never   Smokeless tobacco: Never  Vaping Use   Vaping Use: Never used  Substance and Sexual Activity   Alcohol use: Yes    Comment: occasional wine   Drug use: No   Sexual activity: Not on  file  Other Topics Concern   Not on file  Social History Narrative   Lives at home with husband    Right handed   Caffeine: only what is in some chocolate, no coffee or caffeinated sodas    Social Determinants of Health   Financial Resource Strain: Not on file  Food Insecurity: Not on file  Transportation Needs: Not on file  Physical Activity: Not on file  Stress: Not on file  Social Connections: Not on file    No current facility-administered medications on file prior to encounter.   Current Outpatient Medications on File Prior to Encounter  Medication Sig Dispense Refill   Alum Hydroxide-Mag Carbonate (GAVISCON PO) Take 1 tablet by mouth as needed.     aspirin-acetaminophen-caffeine (EXCEDRIN MIGRAINE) 250-250-65 MG tablet Take 1 tablet by mouth every 6 (six) hours  as needed for headache.     Calcium Carbonate-Vit D-Min (CALCIUM 1200 PO) Take by mouth.     diphenhydramine-acetaminophen (TYLENOL PM) 25-500 MG TABS tablet Take 1 tablet by mouth at bedtime. (Patient not taking: Reported on 01/19/2022)     hyoscyamine (LEVSIN SL) 0.125 MG SL tablet 1-2 sublingually every 4 hours as need for pain 60 tablet 1   methimazole (TAPAZOLE) 5 MG tablet Take 2.5 mg by mouth 2 (two) times daily.     naloxegol oxalate (MOVANTIK) 25 MG TABS tablet 25 mg as needed.     omeprazole (PRILOSEC) 20 MG capsule Take 20 mg by mouth daily.     ondansetron (ZOFRAN) 4 MG tablet Take 1 tablet (4 mg total) by mouth every 8 (eight) hours as needed for nausea or vomiting. 20 tablet 0   simvastatin (ZOCOR) 40 MG tablet Take 40 mg by mouth daily.     traMADol (ULTRAM) 50 MG tablet Take 50 mg by mouth 3 (three) times daily as needed for moderate pain.      Review of Systems: a complete, 10pt review of systems was completed with pertinent positives and negatives as documented in the HPI  Physical Exam: Vitals:   07/23/22 1030 07/23/22 1100  BP: 133/77 138/71  Pulse: 75 70  Resp: 17 11  Temp:    SpO2: 99% 93%   Gen: A&Ox3, no distress  Eyes: lids and conjunctivae normal, no icterus. Pupils equally round and reactive to light.  Neck: supple without mass or thyromegaly Chest: respiratory effort is normal. No crepitus or tenderness on palpation of the chest. Breath sounds equal.  Cardiovascular: RRR with palpable distal pulses, no pedal edema Gastrointestinal: soft, nondistended, currently very minimally tender in the epigastrium and right upper quadrant. No mass, hepatomegaly or splenomegaly.  Lymphatic: no lymphadenopathy in the neck or groin Muscoloskeletal: no clubbing or cyanosis of the fingers.  Strength is symmetrical throughout.  Range of motion of bilateral upper and lower extremities normal without pain, crepitation or contracture. Neuro: cranial nerves grossly intact.   Sensation intact to light touch diffusely. Psych: appropriate mood and affect, normal insight/judgment intact  Skin: warm and dry      Latest Ref Rng & Units 07/23/2022    6:07 AM 05/21/2020    1:58 PM 09/11/2016   11:31 AM  CBC  WBC 4.0 - 10.5 K/uL 12.7  6.8  6.0   Hemoglobin 12.0 - 15.0 g/dL 14.2  14.6  12.0   Hematocrit 36.0 - 46.0 % 41.8  43.0  36.5   Platelets 150 - 400 K/uL 109  123.0  154        Latest Ref  Rng & Units 07/23/2022    6:07 AM 05/21/2020    1:58 PM 09/12/2016    2:37 AM  CMP  Glucose 70 - 99 mg/dL 101  121  105   BUN 8 - 23 mg/dL '20  19  18   '$ Creatinine 0.44 - 1.00 mg/dL 1.16  1.29  1.17   Sodium 135 - 145 mmol/L 139  139  140   Potassium 3.5 - 5.1 mmol/L 4.4  4.3  4.5   Chloride 98 - 111 mmol/L 105  105  109   CO2 22 - 32 mmol/L '23  28  26   '$ Calcium 8.9 - 10.3 mg/dL 9.6  9.9  8.6   Total Protein 6.5 - 8.1 g/dL 6.4  7.2    Total Bilirubin 0.3 - 1.2 mg/dL 0.8  0.6    Alkaline Phos 38 - 126 U/L 80  78    AST 15 - 41 U/L 63  60    ALT 0 - 44 U/L 29  76      No results found for: "INR", "PROTIME"  Imaging: CT ABDOMEN PELVIS W CONTRAST  Result Date: 07/23/2022 CLINICAL DATA:  Left upper quadrant abdominal pain EXAM: CT ABDOMEN AND PELVIS WITH CONTRAST TECHNIQUE: Multidetector CT imaging of the abdomen and pelvis was performed using the standard protocol following bolus administration of intravenous contrast. RADIATION DOSE REDUCTION: This exam was performed according to the departmental dose-optimization program which includes automated exposure control, adjustment of the mA and/or kV according to patient size and/or use of iterative reconstruction technique. CONTRAST:  68m OMNIPAQUE IOHEXOL 350 MG/ML SOLN COMPARISON:  Prior CT scan of the abdomen and pelvis 02/24/2015 FINDINGS: Lower chest: No acute abnormality. Hepatobiliary: Normal hepatic contour morphology. No discrete hepatic lesion. Circumscribed water attenuation cysts have decreased in size compared to prior  imaging. No solid lesion. Mild gallbladder distension without inflammatory change or visible cholelithiasis. No intra or extrahepatic biliary ductal dilatation. Pancreas: Unremarkable. No pancreatic ductal dilatation or surrounding inflammatory changes. Spleen: Normal in size without focal abnormality. Adrenals/Urinary Tract: Normal adrenal glands. Small circumscribed low-attenuation lesions present within the renal hila bilaterally and scattered throughout the left kidney. While too small to characterize, these are almost certainly benign renal sinus cysts and cortical renal cysts. No imaging follow-up recommended. No hydronephrosis, nephrolithiasis or enhancing renal mass. The ureters and bladder are unremarkable. Stomach/Bowel: Unremarkable appearance of the stomach. Diverticulum arising from the superior aspect of the horizontal duodenum. Normal appendix in the right lower quadrant. No evidence of bowel obstruction. No wall thickening or inflammatory changes. There are a few scattered colonic diverticula. Vascular/Lymphatic: Scattered atherosclerotic calcifications throughout the abdominal aorta. No evidence of aneurysm or dissection. No suspicious lymphadenopathy. Reproductive: Uterus and bilateral adnexa are unremarkable. Other: No abdominal wall hernia or abnormality. No abdominopelvic ascites. Musculoskeletal: No acute fracture or aggressive appearing lytic or blastic osseous lesion. Multilevel degenerative disc disease. IMPRESSION: 1. No acute abnormality within the abdomen or pelvis. 2. Mild gallbladder distension without inflammatory change or radiopaque cholelithiasis. 3. Scattered colonic diverticula without evidence of active inflammation. 4. Multilevel degenerative disc disease. 5.  Aortic Atherosclerosis (ICD10-I70.0). Electronically Signed   By: HJacqulynn CadetM.D.   On: 07/23/2022 08:58     A/P: 77year old woman with intermittent severe epigastric pain which spans across the entire upper  abdomen and radiates to the mid back.  Given timeline after eating and quality of symptoms I do think this is very suspicious for biliary etiology however her workup to date has  been unconvincing.  Recommend HIDA scan to evaluate for biliary dyskinesia or a calculus cholecystitis.  Additionally, since her ultrasound was a little over 2 years ago now, I will go ahead and repeat this.  If all of this comes back normal, and she is considered low risk, it may not be unreasonable to consider empiric cholecystectomy which can be done as an outpatient if symptoms remain controlled enough for her to be discharged.  If she has continuing pain or testing will be delayed, would consider medical admission to complete her workup I did go over laparoscopic cholecystectomy with the patient and her husband, and discussed risks of surgery including bleeding, pain, scarring, intraabdominal injury specifically to the common bile duct and sequelae, bile leak, conversion to open surgery, subtotal cholecystectomy, blood clot, pneumonia, heart attack, stroke, failure to resolve symptoms, etc. Questions welcomed and answered to their satisfaction.  Will continue to follow for the results of her imaging.  Addendum 16:25: Ultrasound reviewed, negative for gallstones or sludge, no sonographic evidence of cholecystitis.  Will follow-up HIDA scan.  Patient Active Problem List   Diagnosis Date Noted   Obstructive sleep apnea on CPAP 06/21/2018   Precordial pain 09/11/2016   Hyperthyroidism 09/11/2016   GERD (gastroesophageal reflux disease) 09/11/2016   HLD (hyperlipidemia) 09/11/2016       Romana Juniper, MD [facility]  See AMION to contact appropriate on-call provider

## 2022-07-23 NOTE — ED Triage Notes (Addendum)
Patient arrives via ems from home secondary to upper  abd pain that began 2 hour  ago and irradiates to the back. Patient took 325  mg aspirin and tramadol  prior arrival. Patient states she feels so much better now.

## 2022-07-23 NOTE — Progress Notes (Signed)
Pt says she would prefer to use her home CPAP machine if she is here tomorrow and is uninterested in wearing ours tonight.

## 2022-07-23 NOTE — ED Provider Notes (Signed)
7:23 AM Care assumed from Dr. Roxanne Mins.  At time of transfer of care, patient is waiting for results of CT abdomen pelvis to determine and rule out concerning etiologies of abdominal discomfort.  Anticipate reassessment after imaging completion to determine disposition.  9:41 AM Patient CT scan returned without acute abdomen but did show slightly distended gallbladder compared to prior.  There was no sludge or stones or other inflammatory changes.  Patient is having recurrent discomfort and is quite tender in the epigastric area and right upper quadrant.  Patient said this is worse than it has been in the past.  She does a leukocytosis, persistently elevated AST, and with location of pain with distended gallbladder, will consult general surgery to discuss if they feel a HIDA scan would be beneficial or if they want to evaluate patient themselves.  Will give patient pain and nausea medicine now and keep her n.p.o.  2:53 PM I was informed that the patient cannot get her HIDA scan today.  General surgery saw the patient and feel that if she cannot get her HIDA, she will likely need admission to medicine to get hide in the morning.  They saw her and reviewed her story and workup.  They do feel that an ultrasound would be helpful so they ordered ultrasound of the right upper quadrant.  If it shows something concerning, they will likely admit.  If it does not show something and she is having symptoms, anticipate admission to medicine for HIDA scan in the morning and reassessment of her abdominal pain.  Care will be transferred to oncoming team to await results of ultrasound and disposition determination.   Ernan Runkles, Gwenyth Allegra, MD 07/23/22 [phone]

## 2022-07-23 NOTE — ED Notes (Signed)
ED Provider at bedside. 

## 2022-07-23 NOTE — H&P (Signed)
History and Physical    Heather Kirby [id number] DOB: 11-06-1945 DOA: 07/23/2022  PCP: Donnajean Lopes, MD (Confirm with patient/family/NH records and if not entered, this has to be entered at [facility] point of entry) Patient coming from: Home  I have personally briefly reviewed patient's old medical records in [place]  Chief Complaint: Belly hurts  HPI: Heather Kirby is a 77 y.o. female with medical history significant of HTN, HLD, GERD, hyperthyroidism, presented with recurrent abdominal pain.  Symptoms started yesterday after eating lunch, patient started to have severe 10/10 right upper quadrant abdominal pain burning-like with radiating to the back, feeling nauseous but no vomiting.  She tried to drink a few sips of soda and the pain eased a little bit.  But soon come back.  This morning, same burning like RUQ pain woke her up which made her decide to come to the hospital.  Denies any fever chills no diarrhea.  ED Course: Afebrile, none tachycardia nonhypotensive O2 sat ration 97% on room air.  Blood work showed bilirubin 0.8, AST 63 ALT 29, WBC 12, hemoglobin 14.  CT abdomen pelvis with contrast showed no acute abnormalities but mild gallbladder distention without inflammation changes were gallstones.  RUQ ultrasound showed no gallstones were signs of acute cholecystitis.  ED gave patient 1 dose of fentanyl and RUQ pain significantly improved.  Review of Systems: As per HPI otherwise 14 point review of systems negative.    Past Medical History:  Diagnosis Date   Arthritis    GERD (gastroesophageal reflux disease)    HLD (hyperlipidemia)    Hyperthyroidism    Sleep apnea    Thrombocytopenia ([place])     Past Surgical History:  Procedure Laterality Date   CATARACT EXTRACTION W/ INTRAOCULAR LENS  IMPLANT, BILATERAL     right 8/18 and left was 9/18   CERVICAL ABLATION     irregular pap smear  11/2021   COLONOSCOPY     SHOULDER ARTHROSCOPY Right      reports that she  has never smoked. She has never used smokeless tobacco. She reports current alcohol use. She reports that she does not use drugs.  Allergies  Allergen Reactions   Alendronate Sodium     Other reaction(s): GI upset   Gabapentin     Other reaction(s): Severe Dry Mouth    Family History  Problem Relation Age of Onset   Heart failure Mother    Hypertension Mother    Diabetes Father    Heart disease Father    Breast cancer Sister    Heart disease Sister    Hiatal hernia Maternal Grandmother    Heart disease Maternal Aunt    Stomach cancer Neg Hx    Rectal cancer Neg Hx    Esophageal cancer Neg Hx    Colon cancer Neg Hx      Prior to Admission medications   Medication Sig Start Date End Date Taking? Authorizing Provider  Alum Hydroxide-Mag Carbonate (GAVISCON PO) Take 1 tablet by mouth as needed.    [provider]  aspirin-acetaminophen-caffeine (EXCEDRIN MIGRAINE) [phone] MG tablet Take 1 tablet by mouth every 6 (six) hours as needed for headache.    [provider]  Calcium Carbonate-Vit D-Min (CALCIUM 1200 PO) Take by mouth.    [provider]  diphenhydramine-acetaminophen (TYLENOL PM) 25-500 MG TABS tablet Take 1 tablet by mouth at bedtime. Patient not taking: Reported on 01/19/2022    [provider]  hyoscyamine (LEVSIN SL) 0.125 MG  SL tablet 1-2 sublingually every 4 hours as need for pain 06/15/20   Irene Shipper, MD  methimazole (TAPAZOLE) 5 MG tablet Take 2.5 mg by mouth 2 (two) times daily.    [provider]  naloxegol oxalate (MOVANTIK) 25 MG TABS tablet 25 mg as needed.    [provider]  omeprazole (PRILOSEC) 20 MG capsule Take 20 mg by mouth daily.    [provider]  ondansetron (ZOFRAN) 4 MG tablet Take 1 tablet (4 mg total) by mouth every 8 (eight) hours as needed for nausea or vomiting. 09/12/16   Geradine Girt, DO  simvastatin (ZOCOR) 40 MG tablet Take 40 mg by mouth daily.    [provider]  traMADol (ULTRAM) 50 MG tablet Take 50 mg by mouth 3 (three) times daily as needed for moderate pain.    [provider]    Physical Exam: Vitals:   07/23/22 1339 07/23/22 1400 07/23/22 1430 07/23/22 1500  BP:  (!) 152/138 (!) 145/87 (!) 149/83  Pulse:  69 62 63  Resp:  (!) '21 17 16  '$ Temp: 97.7 F (36.5 C)     TempSrc: Oral     SpO2:  96% 100% 99%  Weight:      Height:        Constitutional: NAD, calm, comfortable Vitals:   07/23/22 1339 07/23/22 1400 07/23/22 1430 07/23/22 1500  BP:  (!) 152/138 (!) 145/87 (!) 149/83  Pulse:  69 62 63  Resp:  (!) '21 17 16  '$ Temp: 97.7 F (36.5 C)     TempSrc: Oral     SpO2:  96% 100% 99%  Weight:      Height:       Eyes: PERRL, lids and conjunctivae normal ENMT: Mucous membranes are moist. Posterior pharynx clear of any exudate or lesions.Normal dentition.  Neck: normal, supple, no masses, no thyromegaly Respiratory: clear to auscultation bilaterally, no wheezing, no crackles. Normal respiratory effort. No accessory muscle use.  Cardiovascular: Regular rate and rhythm, no murmurs / rubs / gallops. No extremity edema. 2+ pedal pulses. No carotid bruits.  Abdomen: no tenderness, no masses palpated. No hepatosplenomegaly. Bowel sounds positive.  Musculoskeletal: no clubbing / cyanosis. No joint deformity upper and lower extremities. Good ROM, no contractures. Normal muscle tone.  Skin: no rashes, lesions, ulcers. No induration Neurologic: CN 2-12 grossly intact. Sensation intact, DTR normal. Strength 5/5 in all 4.  Psychiatric: Normal judgment and insight. Alert and oriented x 3. Normal mood.     Labs on Admission: I have personally reviewed following labs and imaging studies  CBC: Recent Labs  Lab 07/23/22 0607  WBC 12.7*  NEUTROABS 10.9*  HGB 14.2  HCT 41.8  MCV 88.9  PLT 0000000*   Basic Metabolic Panel: Recent Labs  Lab 07/23/22 0607  NA 139  K 4.4  CL 105  CO2 23  GLUCOSE 101*  BUN 20  CREATININE 1.16*   CALCIUM 9.6   GFR: Estimated Creatinine Clearance: 38 mL/min (A) (by C-G formula based on SCr of 1.16 mg/dL (H)). Liver Function Tests: Recent Labs  Lab 07/23/22 0607  AST 63*  ALT 29  ALKPHOS 80  BILITOT 0.8  PROT 6.4*  ALBUMIN 3.9   Recent Labs  Lab 07/23/22 0607  LIPASE 37   No results for input(s): "AMMONIA" in the last 168 hours. Coagulation Profile: No results for input(s): "INR", "PROTIME" in the last 168 hours. Cardiac Enzymes: No results for input(s): "CKTOTAL", "CKMB", "CKMBINDEX", "TROPONINI" in  the last 168 hours. BNP (last 3 results) No results for input(s): "PROBNP" in the last 8760 hours. HbA1C: No results for input(s): "HGBA1C" in the last 72 hours. CBG: No results for input(s): "GLUCAP" in the last 168 hours. Lipid Profile: No results for input(s): "CHOL", "HDL", "LDLCALC", "TRIG", "CHOLHDL", "LDLDIRECT" in the last 72 hours. Thyroid Function Tests: No results for input(s): "TSH", "T4TOTAL", "FREET4", "T3FREE", "THYROIDAB" in the last 72 hours. Anemia Panel: No results for input(s): "VITAMINB12", "FOLATE", "FERRITIN", "TIBC", "IRON", "RETICCTPCT" in the last 72 hours. Urine analysis: No results found for: "COLORURINE", "APPEARANCEUR", "LABSPEC", "PHURINE", "GLUCOSEU", "HGBUR", "BILIRUBINUR", "KETONESUR", "PROTEINUR", "UROBILINOGEN", "NITRITE", "LEUKOCYTESUR"  Radiological Exams on Admission: US Abdomen Limited RUQ (LIVER/GB)  Result Date: 07/23/2022 CLINICAL DATA:  Biliary colic EXAM: ULTRASOUND ABDOMEN LIMITED RIGHT UPPER QUADRANT COMPARISON:  CT abdomen and pelvis 07/23/2022 FINDINGS: Gallbladder: No gallstones or wall thickening visualized. No sonographic Murphy sign noted by sonographer. Common bile duct: Diameter: 3.7 mm Liver: There is a 2.6 x 1.9 x 2.3 cm cyst in the right lobe of the liver as seen on recent CT. Within normal limits in parenchymal echogenicity. Portal vein is patent on color Doppler imaging with normal direction of blood flow  towards the liver. Other: None. IMPRESSION: 1. No acute sonographic findings in the right upper quadrant of the abdomen. 2. There is a 2.6 x 1.9 x 2.3 cm cyst in the right lobe of the liver as seen on recent CT. Electronically Signed   By: Ronney Asters M.D.   On: 07/23/2022 15:31   CT ABDOMEN PELVIS W CONTRAST  Result Date: 07/23/2022 CLINICAL DATA:  Left upper quadrant abdominal pain EXAM: CT ABDOMEN AND PELVIS WITH CONTRAST TECHNIQUE: Multidetector CT imaging of the abdomen and pelvis was performed using the standard protocol following bolus administration of intravenous contrast. RADIATION DOSE REDUCTION: This exam was performed according to the departmental dose-optimization program which includes automated exposure control, adjustment of the mA and/or kV according to patient size and/or use of iterative reconstruction technique. CONTRAST:  64m OMNIPAQUE IOHEXOL 350 MG/ML SOLN COMPARISON:  Prior CT scan of the abdomen and pelvis 02/24/2015 FINDINGS: Lower chest: No acute abnormality. Hepatobiliary: Normal hepatic contour morphology. No discrete hepatic lesion. Circumscribed water attenuation cysts have decreased in size compared to prior imaging. No solid lesion. Mild gallbladder distension without inflammatory change or visible cholelithiasis. No intra or extrahepatic biliary ductal dilatation. Pancreas: Unremarkable. No pancreatic ductal dilatation or surrounding inflammatory changes. Spleen: Normal in size without focal abnormality. Adrenals/Urinary Tract: Normal adrenal glands. Small circumscribed low-attenuation lesions present within the renal hila bilaterally and scattered throughout the left kidney. While too small to characterize, these are almost certainly benign renal sinus cysts and cortical renal cysts. No imaging follow-up recommended. No hydronephrosis, nephrolithiasis or enhancing renal mass. The ureters and bladder are unremarkable. Stomach/Bowel: Unremarkable appearance of the stomach.  Diverticulum arising from the superior aspect of the horizontal duodenum. Normal appendix in the right lower quadrant. No evidence of bowel obstruction. No wall thickening or inflammatory changes. There are a few scattered colonic diverticula. Vascular/Lymphatic: Scattered atherosclerotic calcifications throughout the abdominal aorta. No evidence of aneurysm or dissection. No suspicious lymphadenopathy. Reproductive: Uterus and bilateral adnexa are unremarkable. Other: No abdominal wall hernia or abnormality. No abdominopelvic ascites. Musculoskeletal: No acute fracture or aggressive appearing lytic or blastic osseous lesion. Multilevel degenerative disc disease. IMPRESSION: 1. No acute abnormality within the abdomen or pelvis. 2. Mild gallbladder distension without inflammatory change or radiopaque cholelithiasis. 3. Scattered colonic diverticula without evidence of  active inflammation. 4. Multilevel degenerative disc disease. 5.  Aortic Atherosclerosis (ICD10-I70.0). Electronically Signed   By: Jacqulynn Cadet M.D.   On: 07/23/2022 08:58    EKG: Independently reviewed.  Sinus rhythm, no acute ST changes.  Assessment/Plan Principal Problem:   Cholelithiasis Active Problems:   Hyperthyroidism   GERD (gastroesophageal reflux disease)  (please populate well all problems here in Problem List. (For example, if patient is on BP meds at home and you resume or decide to hold them, it is a problem that needs to be her. Same for CAD, COPD, HLD and so on)  Acute abdominal pain -General surgeon on board and who suspect patient has biliary source of abdominal pain such as acalculus cholecystitis.  Surgeon recommended HIDA scan.  Then decide whether patient will need inpatient cholecystectomy -Right now, patient is afebrile, has slight increase of WBC count with left shift but no bands, BP HR normal, image study and physical exam not showing any signs of acute cholecystitis, will hold off antibiotics for now  repeat CBC and LFT tomorrow. -Other Ddx, troponin negative x 1 and no ST changes on EKG, ACS ruled out. -N.p.o. after midnight for incoming HIDA scan tomorrow  HTN -Stable, continue home BP meds  Hyperthyroidism -Continue methimazole  GERD -Continue PPI  DVT prophylaxis: Lovenox Code Status: Full code Family Communication: Husband at bedside Disposition Plan: Expect less than 2 midnight hospital stay Consults called: General surgery Admission status: MedSurg observation   Lequita Halt MD [facility] Pager [phone]  07/23/2022, 5:18 PM

## 2022-07-23 NOTE — ED Provider Notes (Signed)
[facility] Provider Note   CSN: [id number] Arrival date & time: 07/23/22  0550     History  Chief Complaint  Patient presents with   Abdominal Pain    Heather Kirby is a 77 y.o. female.  The history is provided by the patient.  Abdominal Pain She has history of hyperlipidemia, hypothyroidism, thrombocytopenia, GERD and comes in because epigastric pain radiating to the back.  Pain started about 7 PM last night after eating a cranberry muffin.  There is no associated nausea or vomiting but she did have intense diaphoresis.  Pain subsided and she was able to rest for a couple of hours before pain recurred.  Since arriving in the ED, pain has resolved.  She has had several other episodes of similar pain and has had outpatient workup including ultrasound and upper endoscopy which have been unremarkable.  Frequently, when she gets an episode like this she is able to drink a carbonated beverage and belching get some temporary relief.   Home Medications Prior to Admission medications   Medication Sig Start Date End Date Taking? Authorizing Provider  Alum Hydroxide-Mag Carbonate (GAVISCON PO) Take 1 tablet by mouth as needed.    [provider]  aspirin-acetaminophen-caffeine (EXCEDRIN MIGRAINE) [phone] MG tablet Take 1 tablet by mouth every 6 (six) hours as needed for headache.    [provider]  Calcium Carbonate-Vit D-Min (CALCIUM 1200 PO) Take by mouth.    [provider]  diphenhydramine-acetaminophen (TYLENOL PM) 25-500 MG TABS tablet Take 1 tablet by mouth at bedtime. Patient not taking: Reported on 01/19/2022    [provider]  hyoscyamine (LEVSIN SL) 0.125 MG SL tablet 1-2 sublingually every 4 hours as need for pain 06/15/20   Irene Shipper, MD  methimazole (TAPAZOLE) 5 MG tablet Take 2.5 mg by mouth 2 (two) times daily.    [provider]  naloxegol oxalate (MOVANTIK) 25 MG TABS tablet 25 mg  as needed.    [provider]  omeprazole (PRILOSEC) 20 MG capsule Take 20 mg by mouth daily.    [provider]  ondansetron (ZOFRAN) 4 MG tablet Take 1 tablet (4 mg total) by mouth every 8 (eight) hours as needed for nausea or vomiting. 09/12/16   Geradine Girt, DO  simvastatin (ZOCOR) 40 MG tablet Take 40 mg by mouth daily.    [provider]  traMADol (ULTRAM) 50 MG tablet Take 50 mg by mouth 3 (three) times daily as needed for moderate pain.    [provider]      Allergies    Alendronate sodium and Gabapentin    Review of Systems   Review of Systems  Gastrointestinal:  Positive for abdominal pain.  All other systems reviewed and are negative.   Physical Exam Updated Vital Signs BP (!) 159/82 (BP Location: Right Arm)   Pulse 64   Temp 97.6 F (36.4 C) (Oral)   Resp 16   Ht '5\' 6"'$  (1.676 m)   Wt 62.8 kg   SpO2 97%   BMI 22.35 kg/m  Physical Exam Vitals and nursing note reviewed.   77 year old female, resting comfortably and in no acute distress. Vital signs are significant for elevated blood pressure. Oxygen saturation is 97%, which is normal. Head is normocephalic and atraumatic. PERRLA, EOMI. Oropharynx is clear. Neck is nontender and supple without adenopathy or JVD. Back is nontender and there is no CVA tenderness. Lungs are clear without rales,  wheezes, or rhonchi. Chest is nontender. Heart has regular rate and rhythm without murmur. Abdomen is soft, flat, with mild epigastric tenderness.  There are no masses or hepatosplenomegaly and peristalsis is normoactive. Extremities have no cyanosis or edema, full range of motion is present. Skin is warm and dry without rash. Neurologic: Mental status is normal, cranial nerves are intact, moves all extremities equally.  ED Results / Procedures / Treatments   Labs (all labs ordered are listed, but only abnormal results are displayed) Labs Reviewed  COMPREHENSIVE METABOLIC PANEL -  Abnormal; Notable for the following components:      Result Value   Glucose, Bld 101 (*)    Creatinine, Ser 1.16 (*)    Total Protein 6.4 (*)    AST 63 (*)    GFR, Estimated 49 (*)    All other components within normal limits  CBC WITH DIFFERENTIAL/PLATELET - Abnormal; Notable for the following components:   WBC 12.7 (*)    Platelets 109 (*)    Neutro Abs 10.9 (*)    All other components within normal limits  LIPASE, BLOOD  TROPONIN I (HIGH SENSITIVITY)    EKG EKG Interpretation  Date/Time:  Saturday July 23 2022 06:13:53 EST Ventricular Rate:  67 PR Interval:  165 QRS Duration: 87 QT Interval:  439 QTC Calculation: 464 R Axis:   65 Text Interpretation: Sinus rhythm Normal ECG When compared with ECG of 09/11/2016, No significant change was found Confirmed by Delora Fuel ([id number]) on 07/23/2022 6:17:20 AM  Radiology No results found.  Procedures Procedures  Cardiac monitor shows normal sinus rhythm, per my interpretation.  Medications Ordered in ED Medications - No data to display  ED Course/ Medical Decision Making/ A&P                             Medical Decision Making Amount and/or Complexity of Data Reviewed Labs: ordered. Radiology: ordered.   Epigastric pain of uncertain cause.  Differential diagnosis is broad and includes, but is not limited to, atypical ACS, GERD, biliary colic, peptic ulcer disease, diverticulitis, pancreatitis.  I have reviewed and interpreted her electrocardiogram, and my interpretation is normal ECG.  I reviewed her past records, and she had a normal upper endoscopy on 06/15/2020 and an unremarkable abdominal ultrasound on 06/09/2020 without any evidence of cholelithiasis.  Biliary colic is still possible as patient could have acalculus cholecystitis.  I have ordered laboratory workup of CBC, comprehensive metabolic panel, lipase, single troponin and I have ordered CT of abdomen and pelvis.  I have reviewed and interpreted her laboratory  test, and my interpretation is mild elevation of AST which is chronic, mild renal insufficiency which is unchanged from baseline, mild leukocytosis which is nonspecific, mild to moderate thrombocytopenia which is unchanged from baseline.  CT of abdomen and pelvis is pending.  Case is signed out to Dr. Sherry Ruffing.  Final Clinical Impression(s) / ED Diagnoses Final diagnoses:  Epigastric pain  Elevated blood pressure reading without diagnosis of hypertension  Elevated AST (SGOT)  Renal insufficiency  Thrombocytopenia ([place])    Rx / DC Orders ED Discharge Orders     None         Delora Fuel, MD [id number] [phone]

## 2022-07-23 NOTE — ED Notes (Signed)
ED TO INPATIENT HANDOFF REPORT  ED Nurse Name and Phone #:  Helayne Metsker,rn [phone]  S Name/Age/Gender Heather Kirby 77 y.o. female Room/Bed: 030C/030C  Code Status   Code Status: Prior  Home/SNF/Other Home Patient oriented to: self, place, time, and situation Is this baseline? Yes   Triage Complete: Triage complete  Chief Complaint abd pain  Triage Note Patient arrives via ems from home secondary to upper  abd pain that began 2 hour  ago and irradiates to the back. Patient took 325  mg aspirin and tramadol  prior arrival. Patient states she feels so much better now.    Allergies Allergies  Allergen Reactions   Alendronate Sodium     Other reaction(s): GI upset   Gabapentin     Other reaction(s): Severe Dry Mouth    Level of Care/Admitting Diagnosis ED Disposition     ED Disposition  Admit   Condition  --   Comment  The patient appears reasonably stabilized for admission considering the current resources, flow, and capabilities available in the ED at this time, and I doubt any other [facility] requiring further screening and/or treatment in the ED prior to admission is  present.          B Medical/Surgery History Past Medical History:  Diagnosis Date   Arthritis    GERD (gastroesophageal reflux disease)    HLD (hyperlipidemia)    Hyperthyroidism    Sleep apnea    Thrombocytopenia ([place])    Past Surgical History:  Procedure Laterality Date   CATARACT EXTRACTION W/ INTRAOCULAR LENS  IMPLANT, BILATERAL     right 8/18 and left was 9/18   CERVICAL ABLATION     irregular pap smear  11/2021   COLONOSCOPY     SHOULDER ARTHROSCOPY Right      A IV Location/Drains/Wounds Patient Lines/Drains/Airways Status     Active Line/Drains/Airways     Name Placement date Placement time Site Days   Peripheral IV 07/23/22 22 G Anterior;Right Forearm 07/23/22  0608  Forearm  less than 1            Intake/Output Last 24 hours No intake or output data in the 24 hours  ending 07/23/22 1614  Labs/Imaging Results for orders placed or performed during the hospital encounter of 07/23/22 (from the past 48 hour(s))  Comprehensive metabolic panel     Status: Abnormal   Collection Time: 07/23/22  6:07 AM  Result Value Ref Range   Sodium 139 135 - 145 mmol/L   Potassium 4.4 3.5 - 5.1 mmol/L    Comment: HEMOLYSIS AT THIS LEVEL MAY AFFECT RESULT   Chloride 105 98 - 111 mmol/L   CO2 23 22 - 32 mmol/L   Glucose, Bld 101 (H) 70 - 99 mg/dL    Comment: Glucose reference range applies only to samples taken after fasting for at least 8 hours.   BUN 20 8 - 23 mg/dL   Creatinine, Ser 1.16 (H) 0.44 - 1.00 mg/dL   Calcium 9.6 8.9 - 10.3 mg/dL   Total Protein 6.4 (L) 6.5 - 8.1 g/dL   Albumin 3.9 3.5 - 5.0 g/dL   AST 63 (H) 15 - 41 U/L    Comment: HEMOLYSIS AT THIS LEVEL MAY AFFECT RESULT   ALT 29 0 - 44 U/L    Comment: HEMOLYSIS AT THIS LEVEL MAY AFFECT RESULT   Alkaline Phosphatase 80 38 - 126 U/L   Total Bilirubin 0.8 0.3 - 1.2 mg/dL    Comment: HEMOLYSIS AT THIS  LEVEL MAY AFFECT RESULT   GFR, Estimated 49 (L) >60 mL/min    Comment: (NOTE) Calculated using the CKD-EPI Creatinine Equation (2021)    Anion gap 11 5 - 15    Comment: Performed at [facility], [place] [address], [place], [place] [address]  Troponin I (High Sensitivity)     Status: None   Collection Time: 07/23/22  6:07 AM  Result Value Ref Range   Troponin I (High Sensitivity) 3 <18 ng/L    Comment: (NOTE) Elevated high sensitivity troponin I (hsTnI) values and significant  changes across serial measurements may suggest ACS but many other  chronic and acute conditions are known to elevate hsTnI results.  Refer to the "Links" section for chest pain algorithms and additional  guidance. Performed at [facility], [place] [address], [place], [place] [address]   Lipase, blood     Status: None   Collection Time: 07/23/22  6:07 AM  Result Value Ref Range   Lipase 37 11 - 51 U/L    Comment:  Performed at [facility] [address] [place], [place] [address]  CBC with Differential     Status: Abnormal   Collection Time: 07/23/22  6:07 AM  Result Value Ref Range   WBC 12.7 (H) 4.0 - 10.5 K/uL   RBC 4.70 3.87 - 5.11 MIL/uL   Hemoglobin 14.2 12.0 - 15.0 g/dL   HCT 41.8 36.0 - 46.0 %   MCV 88.9 80.0 - 100.0 fL   MCH 30.2 26.0 - 34.0 pg   MCHC 34.0 30.0 - 36.0 g/dL   RDW 13.2 11.5 - 15.5 %   Platelets 109 (L) 150 - 400 K/uL   nRBC 0.0 0.0 - 0.2 %   Neutrophils Relative % 85 %   Neutro Abs 10.9 (H) 1.7 - 7.7 K/uL   Lymphocytes Relative 7 %   Lymphs Abs 0.9 0.7 - 4.0 K/uL   Monocytes Relative 5 %   Monocytes Absolute 0.6 0.1 - 1.0 K/uL   Eosinophils Relative 2 %   Eosinophils Absolute 0.2 0.0 - 0.5 K/uL   Basophils Relative 0 %   Basophils Absolute 0.0 0.0 - 0.1 K/uL   Immature Granulocytes 1 %   Abs Immature Granulocytes 0.06 0.00 - 0.07 K/uL    Comment: Performed at [facility] [address] [place], [place] [address]   US Abdomen Limited RUQ (LIVER/GB)  Result Date: 07/23/2022 CLINICAL DATA:  Biliary colic EXAM: ULTRASOUND ABDOMEN LIMITED RIGHT UPPER QUADRANT COMPARISON:  CT abdomen and pelvis 07/23/2022 FINDINGS: Gallbladder: No gallstones or wall thickening visualized. No sonographic Murphy sign noted by sonographer. Common bile duct: Diameter: 3.7 mm Liver: There is a 2.6 x 1.9 x 2.3 cm cyst in the right lobe of the liver as seen on recent CT. Within normal limits in parenchymal echogenicity. Portal vein is patent on color Doppler imaging with normal direction of blood flow towards the liver. Other: None. IMPRESSION: 1. No acute sonographic findings in the right upper quadrant of the abdomen. 2. There is a 2.6 x 1.9 x 2.3 cm cyst in the right lobe of the liver as seen on recent CT. Electronically Signed   By: Ronney Asters M.D.   On: 07/23/2022 15:31   CT ABDOMEN PELVIS W CONTRAST  Result Date: 07/23/2022 CLINICAL DATA:  Left upper quadrant abdominal pain  EXAM: CT ABDOMEN AND PELVIS WITH CONTRAST TECHNIQUE: Multidetector CT imaging of the abdomen and pelvis was performed using the  standard protocol following bolus administration of intravenous contrast. RADIATION DOSE REDUCTION: This exam was performed according to the departmental dose-optimization program which includes automated exposure control, adjustment of the mA and/or kV according to patient size and/or use of iterative reconstruction technique. CONTRAST:  55m OMNIPAQUE IOHEXOL 350 MG/ML SOLN COMPARISON:  Prior CT scan of the abdomen and pelvis 02/24/2015 FINDINGS: Lower chest: No acute abnormality. Hepatobiliary: Normal hepatic contour morphology. No discrete hepatic lesion. Circumscribed water attenuation cysts have decreased in size compared to prior imaging. No solid lesion. Mild gallbladder distension without inflammatory change or visible cholelithiasis. No intra or extrahepatic biliary ductal dilatation. Pancreas: Unremarkable. No pancreatic ductal dilatation or surrounding inflammatory changes. Spleen: Normal in size without focal abnormality. Adrenals/Urinary Tract: Normal adrenal glands. Small circumscribed low-attenuation lesions present within the renal hila bilaterally and scattered throughout the left kidney. While too small to characterize, these are almost certainly benign renal sinus cysts and cortical renal cysts. No imaging follow-up recommended. No hydronephrosis, nephrolithiasis or enhancing renal mass. The ureters and bladder are unremarkable. Stomach/Bowel: Unremarkable appearance of the stomach. Diverticulum arising from the superior aspect of the horizontal duodenum. Normal appendix in the right lower quadrant. No evidence of bowel obstruction. No wall thickening or inflammatory changes. There are a few scattered colonic diverticula. Vascular/Lymphatic: Scattered atherosclerotic calcifications throughout the abdominal aorta. No evidence of aneurysm or dissection. No suspicious  lymphadenopathy. Reproductive: Uterus and bilateral adnexa are unremarkable. Other: No abdominal wall hernia or abnormality. No abdominopelvic ascites. Musculoskeletal: No acute fracture or aggressive appearing lytic or blastic osseous lesion. Multilevel degenerative disc disease. IMPRESSION: 1. No acute abnormality within the abdomen or pelvis. 2. Mild gallbladder distension without inflammatory change or radiopaque cholelithiasis. 3. Scattered colonic diverticula without evidence of active inflammation. 4. Multilevel degenerative disc disease. 5.  Aortic Atherosclerosis (ICD10-I70.0). Electronically Signed   By: HJacqulynn CadetM.D.   On: 07/23/2022 08:58    Pending Labs Unresulted Labs (From admission, onward)    None       Vitals/Pain Today's Vitals   07/23/22 1339 07/23/22 1400 07/23/22 1430 07/23/22 1500  BP:  (!) 152/138 (!) 145/87 (!) 149/83  Pulse:  69 62 63  Resp:  (!) '21 17 16  '$ Temp: 97.7 F (36.5 C)     TempSrc: Oral     SpO2:  96% 100% 99%  Weight:      Height:      PainSc:        Isolation Precautions No active isolations  Medications Medications  iohexol (OMNIPAQUE) 350 MG/ML injection 75 mL (75 mLs Intravenous Contrast Given 07/23/22 0852)  fentaNYL (SUBLIMAZE) injection 50 mcg (50 mcg Intravenous Given 07/23/22 0946)  ondansetron (ZOFRAN) injection 4 mg (4 mg Intravenous Given 07/23/22 0946)    Mobility walks     Focused Assessments GI   R Recommendations: See Admitting Provider Note  Report given to:   Additional Notes:

## 2022-07-23 NOTE — ED Notes (Signed)
Attempted 3 times to contact Heather Kirby with [facility] for scan

## 2022-07-24 ENCOUNTER — Observation Stay (HOSPITAL_COMMUNITY): Payer: PPO

## 2022-07-24 DIAGNOSIS — R1013 Epigastric pain: Secondary | ICD-10-CM | POA: Diagnosis not present

## 2022-07-24 DIAGNOSIS — R1010 Upper abdominal pain, unspecified: Secondary | ICD-10-CM | POA: Diagnosis not present

## 2022-07-24 DIAGNOSIS — M546 Pain in thoracic spine: Secondary | ICD-10-CM | POA: Diagnosis not present

## 2022-07-24 LAB — HEPATIC FUNCTION PANEL
ALT: 77 U/L — ABNORMAL HIGH (ref 0–44)
AST: 68 U/L — ABNORMAL HIGH (ref 15–41)
Albumin: 3.7 g/dL (ref 3.5–5.0)
Alkaline Phosphatase: 77 U/L (ref 38–126)
Bilirubin, Direct: 0.1 mg/dL (ref 0.0–0.2)
Indirect Bilirubin: 0.7 mg/dL (ref 0.3–0.9)
Total Bilirubin: 0.8 mg/dL (ref 0.3–1.2)
Total Protein: 6.4 g/dL — ABNORMAL LOW (ref 6.5–8.1)

## 2022-07-24 LAB — CBC
HCT: 41.9 % (ref 36.0–46.0)
Hemoglobin: 14.3 g/dL (ref 12.0–15.0)
MCH: 30.6 pg (ref 26.0–34.0)
MCHC: 34.1 g/dL (ref 30.0–36.0)
MCV: 89.5 fL (ref 80.0–100.0)
Platelets: 120 10*3/uL — ABNORMAL LOW (ref 150–400)
RBC: 4.68 MIL/uL (ref 3.87–5.11)
RDW: 13.3 % (ref 11.5–15.5)
WBC: 5.3 10*3/uL (ref 4.0–10.5)
nRBC: 0 % (ref 0.0–0.2)

## 2022-07-24 MED ORDER — TECHNETIUM TC 99M MEBROFENIN IV KIT
5.5000 | PACK | Freq: Once | INTRAVENOUS | Status: AC | PRN
  Administered 2022-07-24: 5.5 via INTRAVENOUS

## 2022-07-24 MED ORDER — TRAMADOL HCL 50 MG PO TABS
50.0000 mg | ORAL_TABLET | Freq: Three times a day (TID) | ORAL | Status: DC | PRN
  Administered 2022-07-24 – 2022-07-25 (×2): 50 mg via ORAL
  Filled 2022-07-24 (×2): qty 1

## 2022-07-24 MED ORDER — SIMETHICONE 80 MG PO CHEW
160.0000 mg | CHEWABLE_TABLET | Freq: Three times a day (TID) | ORAL | Status: DC
  Administered 2022-07-24 – 2022-07-26 (×5): 160 mg via ORAL
  Filled 2022-07-24 (×10): qty 2

## 2022-07-24 NOTE — Progress Notes (Signed)
Pt refused cpap

## 2022-07-24 NOTE — Progress Notes (Signed)
Subjective: CC: No epigastric or ruq pain this am. Had an episode of luq pain this am. No abdominal pain currently. Denies n/v. NPO for HIDA.   Presenting symptoms: Reports symptoms intermittently for 3 years with 7 or 8 episodes total. She and her husband had a whopper and iced mocha for lunch and then later on went to [place] and had a large cranberry muffin.  About 30 minutes after the later she developed intense epigastric pain which spans across the entire upper abdomen and radiates into the back. No n/v.  Hx EGD Dr. Henrene Pastor in January 2022, this was normal with no hiatal hernia, esophagitis, gastritis, ulcer, etc.  Also had an ultrasound around that time which was negative for gallstones.  She has been evaluated by cardiology in October 2022 and her symptoms were determined not to be cardiac.  Objective: Vital signs in last 24 hours: Temp:  [97.6 F (36.4 C)-98.6 F (37 C)] 97.6 F (36.4 C) (03/10 0755) Pulse Rate:  [62-75] 70 (03/10 0755) Resp:  [11-21] 16 (03/10 0755) BP: (114-184)/(68-138) 146/85 (03/10 0755) SpO2:  [93 %-100 %] 100 % (03/10 0755) Last BM Date : 07/22/22  Intake/Output from previous day: 03/09 0701 - 03/10 0700 In: 120 [P.O.:120] Out: -  Intake/Output this shift: No intake/output data recorded.  PE: Gen:  Alert, NAD, pleasant Abd: Soft, ND, very mild epigastric ttp, no ruq ttp. Negative murphy's sign. +BS  Lab Results:  Recent Labs    07/23/22 0607 07/24/22 0431  WBC 12.7* 5.3  HGB 14.2 14.3  HCT 41.8 41.9  PLT 109* 120*   BMET Recent Labs    07/23/22 0607  NA 139  K 4.4  CL 105  CO2 23  GLUCOSE 101*  BUN 20  CREATININE 1.16*  CALCIUM 9.6   PT/INR No results for input(s): "LABPROT", "INR" in the last 72 hours. CMP     Component Value Date/Time   NA 139 07/23/2022 0607   K 4.4 07/23/2022 0607   CL 105 07/23/2022 0607   CO2 23 07/23/2022 0607   GLUCOSE 101 (H) 07/23/2022 0607   BUN 20 07/23/2022 0607   CREATININE 1.16 (H)  07/23/2022 0607   CALCIUM 9.6 07/23/2022 0607   PROT 6.4 (L) 07/24/2022 0431   ALBUMIN 3.7 07/24/2022 0431   AST 68 (H) 07/24/2022 0431   ALT 77 (H) 07/24/2022 0431   ALKPHOS 77 07/24/2022 0431   BILITOT 0.8 07/24/2022 0431   GFRNONAA 49 (L) 07/23/2022 0607   GFRAA 53 (L) 09/12/2016 0237   Lipase     Component Value Date/Time   LIPASE 37 07/23/2022 [id number]    Studies/Results: US Abdomen Limited RUQ (LIVER/GB)  Result Date: 07/23/2022 CLINICAL DATA:  Biliary colic EXAM: ULTRASOUND ABDOMEN LIMITED RIGHT UPPER QUADRANT COMPARISON:  CT abdomen and pelvis 07/23/2022 FINDINGS: Gallbladder: No gallstones or wall thickening visualized. No sonographic Murphy sign noted by sonographer. Common bile duct: Diameter: 3.7 mm Liver: There is a 2.6 x 1.9 x 2.3 cm cyst in the right lobe of the liver as seen on recent CT. Within normal limits in parenchymal echogenicity. Portal vein is patent on color Doppler imaging with normal direction of blood flow towards the liver. Other: None. IMPRESSION: 1. No acute sonographic findings in the right upper quadrant of the abdomen. 2. There is a 2.6 x 1.9 x 2.3 cm cyst in the right lobe of the liver as seen on recent CT. Electronically Signed   By: Tina Griffiths.D.  On: 07/23/2022 15:31   CT ABDOMEN PELVIS W CONTRAST  Result Date: 07/23/2022 CLINICAL DATA:  Left upper quadrant abdominal pain EXAM: CT ABDOMEN AND PELVIS WITH CONTRAST TECHNIQUE: Multidetector CT imaging of the abdomen and pelvis was performed using the standard protocol following bolus administration of intravenous contrast. RADIATION DOSE REDUCTION: This exam was performed according to the departmental dose-optimization program which includes automated exposure control, adjustment of the mA and/or kV according to patient size and/or use of iterative reconstruction technique. CONTRAST:  14m OMNIPAQUE IOHEXOL 350 MG/ML SOLN COMPARISON:  Prior CT scan of the abdomen and pelvis 02/24/2015 FINDINGS: Lower chest:  No acute abnormality. Hepatobiliary: Normal hepatic contour morphology. No discrete hepatic lesion. Circumscribed water attenuation cysts have decreased in size compared to prior imaging. No solid lesion. Mild gallbladder distension without inflammatory change or visible cholelithiasis. No intra or extrahepatic biliary ductal dilatation. Pancreas: Unremarkable. No pancreatic ductal dilatation or surrounding inflammatory changes. Spleen: Normal in size without focal abnormality. Adrenals/Urinary Tract: Normal adrenal glands. Small circumscribed low-attenuation lesions present within the renal hila bilaterally and scattered throughout the left kidney. While too small to characterize, these are almost certainly benign renal sinus cysts and cortical renal cysts. No imaging follow-up recommended. No hydronephrosis, nephrolithiasis or enhancing renal mass. The ureters and bladder are unremarkable. Stomach/Bowel: Unremarkable appearance of the stomach. Diverticulum arising from the superior aspect of the horizontal duodenum. Normal appendix in the right lower quadrant. No evidence of bowel obstruction. No wall thickening or inflammatory changes. There are a few scattered colonic diverticula. Vascular/Lymphatic: Scattered atherosclerotic calcifications throughout the abdominal aorta. No evidence of aneurysm or dissection. No suspicious lymphadenopathy. Reproductive: Uterus and bilateral adnexa are unremarkable. Other: No abdominal wall hernia or abnormality. No abdominopelvic ascites. Musculoskeletal: No acute fracture or aggressive appearing lytic or blastic osseous lesion. Multilevel degenerative disc disease. IMPRESSION: 1. No acute abnormality within the abdomen or pelvis. 2. Mild gallbladder distension without inflammatory change or radiopaque cholelithiasis. 3. Scattered colonic diverticula without evidence of active inflammation. 4. Multilevel degenerative disc disease. 5.  Aortic Atherosclerosis (ICD10-I70.0).  Electronically Signed   By: HJacqulynn CadetM.D.   On: 07/23/2022 08:58    Anti-infectives: Anti-infectives (From admission, onward)    None        Assessment/Plan Epigastric Abdominal Pain - Patient with intermittent episodes of upper abdominal pain for the last few years. Has undergone cardiac w/u and EGD without clear etiology.  - RUQ [place] gall[place]  - CT w/ mild gallbladder distension w/o inflammatory changes or radiopaque cholelithiasis  - She is afebrile without tachycardia or hypotension. Her wbc is wnl. Lipase wnl. Does have mild elevation of AST (68) and ALT (77) but normal T. Bili - Patient is already scheduled for HIDA scan as outlined in yesterday's note to r/o Acalculous Cholecystitis.  - If all of this comes back normal, and she is considered low risk, it may not be unreasonable to consider empiric cholecystectomy which can be done as an outpatient if symptoms remain controlled enough for her to be discharged.  - No plans for surgery today. Does not need abx currently. If HIDA positive can start Rocephin.   FEN - NPO for HIDA. VTE - SCDs, subq heparin ID - None  HTN HLD GERD Hyperthyroidism   I reviewed nursing notes, hospitalist notes, last 24 h vitals and pain scores, last 48 h intake and output, last 24 h labs and trends, and last 24 h imaging results.    LOS: 0 days    MLegrand Como  Evette Cristal , PA-C [facility] 07/24/2022, 8:20 AM Please see Amion for pager number during day hours 7:00am-4:30pm

## 2022-07-24 NOTE — Care Management Obs Status (Signed)
[facility] NOTIFICATION   Patient Details  Name: Heather Kirby MRN: [id number] Date of Birth: [birth year]   Medicare Observation Status Notification Given:  Yes    Carles Collet, RN 07/24/2022, 3:05 PM

## 2022-07-24 NOTE — Progress Notes (Signed)
Patient was sent to the Radiology via wheel chair at 1010 AM. Patient is alert and oriented.

## 2022-07-24 NOTE — Progress Notes (Signed)
[facility] Progress Note  Patient: Heather Kirby     [id number]  DOA: 07/23/2022   PCP: Donnajean Lopes, MD       Brief hospital course: 77 y/o with HTn, GERD, hyperthyroidism presented with upper abdominal pain starting after she had have a burger and later a muffin.No associated nausea or diarrhea.  She has had episodes like this in the past but a GI work up and a cardiology work up have been negative.  Subjective:  Having intermittent abdominal pain across the abdomen and into her back. She states drinking soda and belching helped in the past but is not helping now. Her pain has been getting worse with every episode. It sometimes radiates into her chest, sometimes is on the left and sometimes on the right but always in the upper abdomen. She feels sore in her upper abdomen. She takes her Prilosec daily.    Assessment and Plan: Principal Problem:   Acute epigastric pain-  GERD (gastroesophageal reflux disease) - oupt eval by Dr Henrene Pastor and EGD neg in 2022 - cardiology eval neg - has gallstones but HIDA neg - ? Esophageal spasms or gas or food allergy - have asked her to avoid fatty and acidic food - advance to regular diet - check thoracic MRI as it seems to radiate into her back (may be coming from her back) - start Simethicone QID - may need to try Bentyl or Nitro- may need to investigate further for food allergies via functional medicine testing - cont PPI  Active Problems:   Hyperthyroidism - cont Tapazole       Code Status: Full Code Consultants: general surgery Level of Care: Level of care: Med-Surg Total time on patient care: 35 min DVT prophylaxis:  heparin injection 5,000 Units Start: 07/23/22 2200     Objective:   Vitals:   07/23/22 1957 07/24/22 0439 07/24/22 0755 07/24/22 1704  BP: 119/72  (!) 146/85 137/86  Pulse: 73 63 70 67  Resp: '18 16 16 16  '$ Temp: 97.9 F (36.6 C) 97.6 F (36.4 C) 97.6 F (36.4 C) (!) 97.5 F (36.4 C)  TempSrc:  Oral Oral Oral Oral  SpO2: 98% 94% 100% 96%  Weight:      Height:       Filed Weights   07/23/22 0557  Weight: 62.8 kg   Exam: General exam: Appears comfortable  HEENT: oral mucosa moist Respiratory system: Clear to auscultation.  Cardiovascular system: S1 & S2 heard  Gastrointestinal system: Abdomen soft,  tender in epigastrium, nondistended. Normal bowel sounds   Extremities: No cyanosis, clubbing or edema Psychiatry:  Mood & affect appropriate.      CBC: Recent Labs  Lab 07/23/22 0607 07/24/22 0431  WBC 12.7* 5.3  NEUTROABS 10.9*  --   HGB 14.2 14.3  HCT 41.8 41.9  MCV 88.9 89.5  PLT 109* [id number]*   Basic Metabolic Panel: Recent Labs  Lab 07/23/22 0607  NA 139  K 4.4  CL 105  CO2 23  GLUCOSE 101*  BUN 20  CREATININE 1.16*  CALCIUM 9.6   GFR: Estimated Creatinine Clearance: 38 mL/min (A) (by C-G formula based on SCr of 1.16 mg/dL (H)).  Scheduled Meds:  heparin  5,000 Units Subcutaneous Q12H   methimazole  2.5 mg Oral BID   pantoprazole  40 mg Oral Daily   simethicone  160 mg Oral TID AC & HS   simvastatin  40 mg Oral Daily   Continuous Infusions: Imaging and lab data was  personally reviewed NM Hepatobiliary Liver Func  Result Date: 07/24/2022 CLINICAL DATA:  77 year old female with upper abdominal pain. Unrevealing right upper quadrant ultrasound, CT Abdomen and Pelvis. EXAM: NUCLEAR MEDICINE HEPATOBILIARY IMAGING TECHNIQUE: Sequential images of the abdomen were obtained out to 60 minutes following intravenous administration of radiopharmaceutical. RADIOPHARMACEUTICALS:  5.5 mCi Tc-62m Choletec IV COMPARISON:  CT Abdomen and Pelvis and ultrasound yesterday. FINDINGS: Prompt radiotracer uptake by the liver and clearance of the blood pool. Immediate CBD activity, and early gallbladder activity which is visible within 10-15 minutes. Both gallbladder and small bowel activity increase over the course of the exam. No abnormal radiotracer accumulation identified.  IMPRESSION: Normal hepatobiliary scan, patent CBD and cystic duct. Electronically Signed   By: HGenevie AnnM.D.   On: 07/24/2022 11:50   [place]domen Limited RUQ (LIVER/GB)  Result Date: 07/23/2022 CLINICAL DATA:  Biliary colic EXAM: ULTRASOUND ABDOMEN LIMITED RIGHT UPPER QUADRANT COMPARISON:  CT abdomen and pelvis 07/23/2022 FINDINGS: Gallbladder: No gallstones or wall thickening visualized. No sonographic Murphy sign noted by sonographer. Common bile duct: Diameter: 3.7 mm Liver: There is a 2.6 x 1.9 x 2.3 cm cyst in the right lobe of the liver as seen on recent CT. Within normal limits in parenchymal echogenicity. Portal vein is patent on color Doppler imaging with normal direction of blood flow towards the liver. Other: None. IMPRESSION: 1. No acute sonographic findings in the right upper quadrant of the abdomen. 2. There is a 2.6 x 1.9 x 2.3 cm cyst in the right lobe of the liver as seen on recent CT. Electronically Signed   By: ARonney AstersM.D.   On: 07/23/2022 15:31   CT ABDOMEN PELVIS W CONTRAST  Result Date: 07/23/2022 CLINICAL DATA:  Left upper quadrant abdominal pain EXAM: CT ABDOMEN AND PELVIS WITH CONTRAST TECHNIQUE: Multidetector CT imaging of the abdomen and pelvis was performed using the standard protocol following bolus administration of intravenous contrast. RADIATION DOSE REDUCTION: This exam was performed according to the departmental dose-optimization program which includes automated exposure control, adjustment of the mA and/or kV according to patient size and/or use of iterative reconstruction technique. CONTRAST:  725mOMNIPAQUE IOHEXOL 350 MG/ML SOLN COMPARISON:  Prior CT scan of the abdomen and pelvis 02/24/2015 FINDINGS: Lower chest: No acute abnormality. Hepatobiliary: Normal hepatic contour morphology. No discrete hepatic lesion. Circumscribed water attenuation cysts have decreased in size compared to prior imaging. No solid lesion. Mild gallbladder distension without inflammatory  change or visible cholelithiasis. No intra or extrahepatic biliary ductal dilatation. Pancreas: Unremarkable. No pancreatic ductal dilatation or surrounding inflammatory changes. Spleen: Normal in size without focal abnormality. Adrenals/Urinary Tract: Normal adrenal glands. Small circumscribed low-attenuation lesions present within the renal hila bilaterally and scattered throughout the left kidney. While too small to characterize, these are almost certainly benign renal sinus cysts and cortical renal cysts. No imaging follow-up recommended. No hydronephrosis, nephrolithiasis or enhancing renal mass. The ureters and bladder are unremarkable. Stomach/Bowel: Unremarkable appearance of the stomach. Diverticulum arising from the superior aspect of the horizontal duodenum. Normal appendix in the right lower quadrant. No evidence of bowel obstruction. No wall thickening or inflammatory changes. There are a few scattered colonic diverticula. Vascular/Lymphatic: Scattered atherosclerotic calcifications throughout the abdominal aorta. No evidence of aneurysm or dissection. No suspicious lymphadenopathy. Reproductive: Uterus and bilateral adnexa are unremarkable. Other: No abdominal wall hernia or abnormality. No abdominopelvic ascites. Musculoskeletal: No acute fracture or aggressive appearing lytic or blastic osseous lesion. Multilevel degenerative disc disease. IMPRESSION: 1. No acute abnormality  within the abdomen or pelvis. 2. Mild gallbladder distension without inflammatory change or radiopaque cholelithiasis. 3. Scattered colonic diverticula without evidence of active inflammation. 4. Multilevel degenerative disc disease. 5.  Aortic Atherosclerosis (ICD10-I70.0). Electronically Signed   By: Jacqulynn Cadet M.D.   On: 07/23/2022 08:58    LOS: 0 days   Author: Debbe Odea  07/24/2022 5:56 PM  To contact [facility]>   Check the care team in [facility] and look for the attending/consulting [place] provider listed   Log into [email or web address] and use [place] universal password   Go to> "[facility]"  and find provider  If you still have difficulty reaching the provider, please page the [facility] (Director on Call) for the Hospitalists listed on amion

## 2022-07-24 NOTE — Progress Notes (Signed)
Patient received back on the floor after MRI.

## 2022-07-24 NOTE — Progress Notes (Signed)
Patient was sent for MRI via bed at 1710 PM.

## 2022-07-25 ENCOUNTER — Other Ambulatory Visit: Payer: Self-pay

## 2022-07-25 ENCOUNTER — Telehealth: Payer: Self-pay | Admitting: Adult Health

## 2022-07-25 ENCOUNTER — Encounter (HOSPITAL_COMMUNITY): Payer: Self-pay | Admitting: Internal Medicine

## 2022-07-25 ENCOUNTER — Observation Stay (HOSPITAL_COMMUNITY): Payer: PPO | Admitting: Critical Care Medicine

## 2022-07-25 ENCOUNTER — Encounter (HOSPITAL_COMMUNITY)
Admission: EM | Disposition: A | Discharge status: Home / Self Care | Payer: Self-pay | Source: Home / Self Care | Attending: Internal Medicine

## 2022-07-25 DIAGNOSIS — R1013 Epigastric pain: Secondary | ICD-10-CM | POA: Diagnosis not present

## 2022-07-25 DIAGNOSIS — K812 Acute cholecystitis with chronic cholecystitis: Secondary | ICD-10-CM | POA: Diagnosis not present

## 2022-07-25 DIAGNOSIS — K819 Cholecystitis, unspecified: Secondary | ICD-10-CM | POA: Diagnosis not present

## 2022-07-25 HISTORY — PX: CHOLECYSTECTOMY: SHX55

## 2022-07-25 LAB — CBC
HCT: 44.1 % (ref 36.0–46.0)
Hemoglobin: 14.9 g/dL (ref 12.0–15.0)
MCH: 30 pg (ref 26.0–34.0)
MCHC: 33.8 g/dL (ref 30.0–36.0)
MCV: 88.9 fL (ref 80.0–100.0)
Platelets: 121 10*3/uL — ABNORMAL LOW (ref 150–400)
RBC: 4.96 MIL/uL (ref 3.87–5.11)
RDW: 13.3 % (ref 11.5–15.5)
WBC: 5 10*3/uL (ref 4.0–10.5)
nRBC: 0 % (ref 0.0–0.2)

## 2022-07-25 SURGERY — LAPAROSCOPIC CHOLECYSTECTOMY
Anesthesia: General | Site: Abdomen

## 2022-07-25 MED ORDER — ONDANSETRON HCL 4 MG/2ML IJ SOLN
INTRAMUSCULAR | Status: DC | PRN
  Administered 2022-07-25: 4 mg via INTRAVENOUS

## 2022-07-25 MED ORDER — PHENYLEPHRINE HCL-NACL 20-0.9 MG/250ML-% IV SOLN
INTRAVENOUS | Status: DC | PRN
  Administered 2022-07-25: 30 ug/min via INTRAVENOUS

## 2022-07-25 MED ORDER — SODIUM CHLORIDE 0.9 % IV SOLN
2.0000 g | INTRAVENOUS | Status: DC
  Filled 2022-07-25: qty 20

## 2022-07-25 MED ORDER — PHENYLEPHRINE 80 MCG/ML (10ML) SYRINGE FOR IV PUSH (FOR BLOOD PRESSURE SUPPORT)
PREFILLED_SYRINGE | INTRAVENOUS | Status: DC | PRN
  Administered 2022-07-25 (×2): 80 ug via INTRAVENOUS

## 2022-07-25 MED ORDER — SENNOSIDES-DOCUSATE SODIUM 8.6-50 MG PO TABS: 1.0000 | ORAL_TABLET | Freq: Every evening | ORAL | Status: DC | PRN

## 2022-07-25 MED ORDER — SUCCINYLCHOLINE CHLORIDE 200 MG/10ML IV SOSY
PREFILLED_SYRINGE | INTRAVENOUS | Status: AC
  Filled 2022-07-25: qty 20

## 2022-07-25 MED ORDER — PROPOFOL 10 MG/ML IV BOLUS
INTRAVENOUS | Status: DC | PRN
  Administered 2022-07-25: 100 mg via INTRAVENOUS
  Administered 2022-07-25: 50 mg via INTRAVENOUS

## 2022-07-25 MED ORDER — PROPOFOL 10 MG/ML IV BOLUS
INTRAVENOUS | Status: AC
  Filled 2022-07-25: qty 20

## 2022-07-25 MED ORDER — TRAZODONE HCL 50 MG PO TABS
50.0000 mg | ORAL_TABLET | Freq: Every evening | ORAL | Status: DC | PRN
  Administered 2022-07-25: 50 mg via ORAL
  Filled 2022-07-25: qty 1

## 2022-07-25 MED ORDER — IPRATROPIUM-ALBUTEROL 0.5-2.5 (3) MG/3ML IN SOLN: 3.0000 mL | RESPIRATORY_TRACT | Status: DC | PRN

## 2022-07-25 MED ORDER — PHENYLEPHRINE 80 MCG/ML (10ML) SYRINGE FOR IV PUSH (FOR BLOOD PRESSURE SUPPORT)
PREFILLED_SYRINGE | INTRAVENOUS | Status: AC
  Filled 2022-07-25: qty 10

## 2022-07-25 MED ORDER — LIDOCAINE 2% (20 MG/ML) 5 ML SYRINGE
INTRAMUSCULAR | Status: DC | PRN
  Administered 2022-07-25: 60 mg via INTRAVENOUS

## 2022-07-25 MED ORDER — MORPHINE SULFATE (PF) 2 MG/ML IV SOLN
2.0000 mg | INTRAVENOUS | Status: DC | PRN
  Administered 2022-07-25: 2 mg via INTRAVENOUS
  Filled 2022-07-25: qty 1

## 2022-07-25 MED ORDER — METOPROLOL TARTRATE 5 MG/5ML IV SOLN: 5.0000 mg | INTRAVENOUS | Status: DC | PRN

## 2022-07-25 MED ORDER — CHLORHEXIDINE GLUCONATE 0.12 % MT SOLN
15.0000 mL | Freq: Once | OROMUCOSAL | Status: AC
  Administered 2022-07-25: 15 mL via OROMUCOSAL

## 2022-07-25 MED ORDER — LIDOCAINE 2% (20 MG/ML) 5 ML SYRINGE
INTRAMUSCULAR | Status: AC
  Filled 2022-07-25: qty 20

## 2022-07-25 MED ORDER — OXYCODONE HCL 5 MG/5ML PO SOLN: 5.0000 mg | Freq: Once | ORAL | Status: AC | PRN

## 2022-07-25 MED ORDER — ONDANSETRON HCL 4 MG/2ML IJ SOLN: 4.0000 mg | Freq: Four times a day (QID) | INTRAMUSCULAR | Status: DC | PRN

## 2022-07-25 MED ORDER — OXYCODONE HCL 5 MG PO TABS
ORAL_TABLET | ORAL | Status: AC
  Filled 2022-07-25: qty 1

## 2022-07-25 MED ORDER — ONDANSETRON HCL 4 MG/2ML IJ SOLN
INTRAMUSCULAR | Status: AC
  Filled 2022-07-25: qty 2

## 2022-07-25 MED ORDER — SUGAMMADEX SODIUM 200 MG/2ML IV SOLN
INTRAVENOUS | Status: DC | PRN
  Administered 2022-07-25: 200 mg via INTRAVENOUS

## 2022-07-25 MED ORDER — 0.9 % SODIUM CHLORIDE (POUR BTL) OPTIME
TOPICAL | Status: DC | PRN
  Administered 2022-07-25: 1000 mL

## 2022-07-25 MED ORDER — LACTATED RINGERS IV SOLN: INTRAVENOUS | Status: DC

## 2022-07-25 MED ORDER — BUPIVACAINE HCL (PF) 0.25 % IJ SOLN
INTRAMUSCULAR | Status: AC
  Filled 2022-07-25: qty 30

## 2022-07-25 MED ORDER — OXYCODONE HCL 5 MG PO TABS
5.0000 mg | ORAL_TABLET | Freq: Once | ORAL | Status: AC | PRN
  Administered 2022-07-25: 5 mg via ORAL

## 2022-07-25 MED ORDER — TRAMADOL HCL 50 MG PO TABS
50.0000 mg | ORAL_TABLET | ORAL | Status: DC | PRN
  Administered 2022-07-25 – 2022-07-26 (×2): 50 mg via ORAL
  Filled 2022-07-25 (×2): qty 1

## 2022-07-25 MED ORDER — ORAL CARE MOUTH RINSE: 15.0000 mL | Freq: Once | OROMUCOSAL | Status: AC

## 2022-07-25 MED ORDER — FENTANYL CITRATE (PF) 100 MCG/2ML IJ SOLN
INTRAMUSCULAR | Status: AC
  Filled 2022-07-25: qty 2

## 2022-07-25 MED ORDER — FENTANYL CITRATE (PF) 100 MCG/2ML IJ SOLN
25.0000 ug | INTRAMUSCULAR | Status: DC | PRN
  Administered 2022-07-25: 25 ug via INTRAVENOUS

## 2022-07-25 MED ORDER — SODIUM CHLORIDE 0.9 % IR SOLN
Status: DC | PRN
  Administered 2022-07-25: 1000 mL

## 2022-07-25 MED ORDER — FENTANYL CITRATE (PF) 250 MCG/5ML IJ SOLN
INTRAMUSCULAR | Status: AC
  Filled 2022-07-25: qty 5

## 2022-07-25 MED ORDER — FENTANYL CITRATE (PF) 250 MCG/5ML IJ SOLN
INTRAMUSCULAR | Status: DC | PRN
  Administered 2022-07-25: 50 ug via INTRAVENOUS
  Administered 2022-07-25: 100 ug via INTRAVENOUS
  Administered 2022-07-25 (×2): 50 ug via INTRAVENOUS

## 2022-07-25 MED ORDER — HYDRALAZINE HCL 20 MG/ML IJ SOLN: 10.0000 mg | INTRAMUSCULAR | Status: DC | PRN

## 2022-07-25 MED ORDER — GUAIFENESIN 100 MG/5ML PO LIQD: 5.0000 mL | ORAL | Status: DC | PRN

## 2022-07-25 MED ORDER — DEXAMETHASONE SODIUM PHOSPHATE 10 MG/ML IJ SOLN
INTRAMUSCULAR | Status: DC | PRN
  Administered 2022-07-25: 4 mg via INTRAVENOUS

## 2022-07-25 MED ORDER — BUPIVACAINE-EPINEPHRINE 0.25% -1:200000 IJ SOLN
INTRAMUSCULAR | Status: DC | PRN
  Administered 2022-07-25: 20 mL

## 2022-07-25 MED ORDER — ROCURONIUM BROMIDE 10 MG/ML (PF) SYRINGE
PREFILLED_SYRINGE | INTRAVENOUS | Status: DC | PRN
  Administered 2022-07-25: 50 mg via INTRAVENOUS

## 2022-07-25 MED ORDER — DEXAMETHASONE SODIUM PHOSPHATE 10 MG/ML IJ SOLN
INTRAMUSCULAR | Status: AC
  Filled 2022-07-25: qty 2

## 2022-07-25 MED ORDER — HEPARIN SODIUM (PORCINE) 5000 UNIT/ML IJ SOLN
5000.0000 [IU] | Freq: Two times a day (BID) | INTRAMUSCULAR | Status: DC
  Administered 2022-07-26: 5000 [IU] via SUBCUTANEOUS
  Filled 2022-07-25: qty 1

## 2022-07-25 SURGICAL SUPPLY — 43 items
APPLIER CLIP 5 13 M/L LIGAMAX5 (MISCELLANEOUS) ×1
BAG COUNTER SPONGE SURGICOUNT (BAG) ×1 IMPLANT
BLADE CLIPPER SURG (BLADE) IMPLANT
CANISTER SUCT 3000ML PPV (MISCELLANEOUS) ×1 IMPLANT
CHLORAPREP W/TINT 26 (MISCELLANEOUS) ×1 IMPLANT
CLIP APPLIE 5 13 M/L LIGAMAX5 (MISCELLANEOUS) ×1 IMPLANT
CNTNR URN SCR LID CUP LEK RST (MISCELLANEOUS) IMPLANT
CONT SPEC 4OZ STRL OR WHT (MISCELLANEOUS) ×1
COVER SURGICAL LIGHT HANDLE (MISCELLANEOUS) ×1 IMPLANT
DERMABOND ADVANCED .7 DNX12 (GAUZE/BANDAGES/DRESSINGS) ×1 IMPLANT
ELECT REM PT RETURN 9FT ADLT (ELECTROSURGICAL) ×1
ELECTRODE REM PT RTRN 9FT ADLT (ELECTROSURGICAL) ×1 IMPLANT
GLOVE BIO SURGEON STRL SZ8 (GLOVE) ×1 IMPLANT
GLOVE BIOGEL PI IND STRL 8 (GLOVE) ×1 IMPLANT
GOWN STRL REUS W/ TWL LRG LVL3 (GOWN DISPOSABLE) ×2 IMPLANT
GOWN STRL REUS W/ TWL XL LVL3 (GOWN DISPOSABLE) ×1 IMPLANT
GOWN STRL REUS W/TWL LRG LVL3 (GOWN DISPOSABLE) ×2
GOWN STRL REUS W/TWL XL LVL3 (GOWN DISPOSABLE) ×1
IRRIG SUCT STRYKERFLOW 2 WTIP (MISCELLANEOUS) ×1
IRRIGATION SUCT STRKRFLW 2 WTP (MISCELLANEOUS) ×1 IMPLANT
KIT BASIN OR (CUSTOM PROCEDURE TRAY) ×1 IMPLANT
KIT TURNOVER KIT B (KITS) ×1 IMPLANT
L-HOOK LAP DISP 36CM (ELECTROSURGICAL) ×1
LHOOK LAP DISP 36CM (ELECTROSURGICAL) ×1 IMPLANT
NDL 22X1.5 STRL (OR ONLY) (MISCELLANEOUS) ×1 IMPLANT
NEEDLE 22X1.5 STRL (OR ONLY) (MISCELLANEOUS) ×1 IMPLANT
NS IRRIG 1000ML POUR BTL (IV SOLUTION) ×1 IMPLANT
PAD ARMBOARD 7.5X6 YLW CONV (MISCELLANEOUS) ×1 IMPLANT
PENCIL BUTTON HOLSTER BLD 10FT (ELECTRODE) ×1 IMPLANT
POUCH RETRIEVAL ECOSAC 10 (ENDOMECHANICALS) ×1 IMPLANT
POUCH RETRIEVAL ECOSAC 10MM (ENDOMECHANICALS) ×1
SCISSORS LAP 5X35 DISP (ENDOMECHANICALS) ×1 IMPLANT
SET TUBE SMOKE EVAC HIGH FLOW (TUBING) ×1 IMPLANT
SLEEVE Z-THREAD 5X100MM (TROCAR) ×2 IMPLANT
SPECIMEN JAR SMALL (MISCELLANEOUS) ×1 IMPLANT
SUT VIC AB 4-0 PS2 27 (SUTURE) ×1 IMPLANT
TOWEL GREEN STERILE (TOWEL DISPOSABLE) ×1 IMPLANT
TOWEL GREEN STERILE FF (TOWEL DISPOSABLE) ×1 IMPLANT
TRAY LAPAROSCOPIC MC (CUSTOM PROCEDURE TRAY) ×1 IMPLANT
TROCAR BALLN 12MMX100 BLUNT (TROCAR) ×1 IMPLANT
TROCAR Z-THREAD OPTICAL 5X100M (TROCAR) ×1 IMPLANT
WARMER LAPAROSCOPE (MISCELLANEOUS) ×1 IMPLANT
WATER STERILE IRR 1000ML POUR (IV SOLUTION) ×1 IMPLANT

## 2022-07-25 NOTE — Anesthesia Procedure Notes (Signed)
Procedure Name: Intubation Date/Time: 07/25/2022 1:18 PM  Performed by: Wilburn Cornelia, CRNAPre-anesthesia Checklist: Patient identified, Emergency Drugs available, Suction available, Patient being monitored and Timeout performed Patient Re-evaluated:Patient Re-evaluated prior to induction Oxygen Delivery Method: Circle system utilized Preoxygenation: Pre-oxygenation with 100% oxygen Induction Type: IV induction Ventilation: Mask ventilation without difficulty Laryngoscope Size: Mac and 3 Grade View: Grade I Tube type: Oral Tube size: 7.0 mm Number of attempts: 1 Airway Equipment and Method: Stylet Placement Confirmation: ETT inserted through vocal cords under direct vision, positive ETCO2, CO2 detector and breath sounds checked- equal and bilateral Secured at: 21 cm Tube secured with: Tape Dental Injury: Teeth and Oropharynx as per pre-operative assessment

## 2022-07-25 NOTE — Progress Notes (Signed)
Subjective: CC: Reports mild epigastric and left upper abdominal pain yesterday, states it was much less severe compared to presentation and has since resolved. Denies nausea or vomiting.   Patient  confirmed her history with me -- Reports symptoms intermittently for 2 years with 7 or 8 episodes total. She and her husband had a whopper and iced mocha for lunch Friday and then later on went to [place] and had a large cranberry muffin.  About 30 minutes after the later she developed intense epigastric pain which spans across the entire upper abdomen and radiates into the back. No n/v.  Hx EGD Dr. Henrene Pastor in January 2022, this was normal with no hiatal hernia, esophagitis, gastritis, ulcer, etc.  Also had an ultrasound around that time which was negative for gallstones.  She has been evaluated by cardiology in October 2022 and her symptoms were determined not to be cardiac.  Objective: Vital signs in last 24 hours: Temp:  [97.3 F (36.3 C)-97.6 F (36.4 C)] 97.3 F (36.3 C) (03/11 0753) Pulse Rate:  [61-83] 61 (03/11 0753) Resp:  [16-18] 18 (03/11 0753) BP: (127-144)/(71-86) 144/83 (03/11 0753) SpO2:  [96 %-100 %] 100 % (03/11 0753) Last BM Date : 07/22/22  Intake/Output from previous day: No intake/output data recorded. Intake/Output this shift: No intake/output data recorded.  PE: Gen:  Alert, NAD, pleasant Abd: Soft, non-tender, non-distended.   Lab Results:  Recent Labs    07/23/22 0607 07/24/22 0431  WBC 12.7* 5.3  HGB 14.2 14.3  HCT 41.8 41.9  PLT 109* 120*   BMET Recent Labs    07/23/22 0607  NA 139  K 4.4  CL 105  CO2 23  GLUCOSE 101*  BUN 20  CREATININE 1.16*  CALCIUM 9.6   PT/INR No results for input(s): "LABPROT", "INR" in the last 72 hours. CMP     Component Value Date/Time   NA 139 07/23/2022 0607   K 4.4 07/23/2022 0607   CL 105 07/23/2022 0607   CO2 23 07/23/2022 0607   GLUCOSE 101 (H) 07/23/2022 0607   BUN 20 07/23/2022 0607    CREATININE 1.16 (H) 07/23/2022 0607   CALCIUM 9.6 07/23/2022 0607   PROT 6.4 (L) 07/24/2022 0431   ALBUMIN 3.7 07/24/2022 0431   AST 68 (H) 07/24/2022 0431   ALT 77 (H) 07/24/2022 0431   ALKPHOS 77 07/24/2022 0431   BILITOT 0.8 07/24/2022 0431   GFRNONAA 49 (L) 07/23/2022 0607   GFRAA 53 (L) 09/12/2016 0237   Lipase     Component Value Date/Time   LIPASE 37 07/23/2022 0607    Studies/Results: MR THORACIC SPINE WO CONTRAST  Result Date: 07/24/2022 CLINICAL DATA:  Mid back pain EXAM: MRI THORACIC SPINE WITHOUT CONTRAST TECHNIQUE: Multiplanar, multisequence MR imaging of the thoracic spine was performed. No intravenous contrast was administered. COMPARISON:  None Available. FINDINGS: Alignment:  Physiologic. Vertebrae: T7 hemangioma.  No acute abnormality. Cord: Spinal cord is normal. At the T9-10 level, there is a dorsal collection measuring 6 mm AP and 38 mm cc. Paraspinal and other soft tissues: Negative. Disc levels: T3-4: Small disc bulge without stenosis. T7-8: Small central disc protrusion without stenosis. T8-9: Small central disc protrusion without stenosis. T9-10: Small right subarticular disc protrusion without stenosis. T10-11: Small right subarticular disc protrusion without stenosis. The other thoracic levels are normal. IMPRESSION: 1. 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 2. Mild thoracic degenerative disc  disease without stenosis. Electronically Signed   By: Ulyses Jarred M.D.   On: 07/24/2022 23:01   NM Hepatobiliary Liver Func  Result Date: 07/24/2022 CLINICAL DATA:  77 year old female with upper abdominal pain. Unrevealing right upper quadrant ultrasound, CT Abdomen and Pelvis. EXAM: NUCLEAR MEDICINE HEPATOBILIARY IMAGING TECHNIQUE: Sequential images of the abdomen were obtained out to 60 minutes following intravenous administration of radiopharmaceutical. RADIOPHARMACEUTICALS:  5.5 mCi Tc-47m Choletec IV  COMPARISON:  CT Abdomen and Pelvis and ultrasound yesterday. FINDINGS: Prompt radiotracer uptake by the liver and clearance of the blood pool. Immediate CBD activity, and early gallbladder activity which is visible within 10-15 minutes. Both gallbladder and small bowel activity increase over the course of the exam. No abnormal radiotracer accumulation identified. IMPRESSION: Normal hepatobiliary scan, patent CBD and cystic duct. Electronically Signed   By: HGenevie AnnM.D.   On: 07/24/2022 11:50   [place]Abdomen Limited RUQ (LIVER/GB)  Result Date: 07/23/2022 CLINICAL DATA:  Biliary colic EXAM: ULTRASOUND ABDOMEN LIMITED RIGHT UPPER QUADRANT COMPARISON:  CT abdomen and pelvis 07/23/2022 FINDINGS: Gallbladder: No gallstones or wall thickening visualized. No sonographic Murphy sign noted by sonographer. Common bile duct: Diameter: 3.7 mm Liver: There is a 2.6 x 1.9 x 2.3 cm cyst in the right lobe of the liver as seen on recent CT. Within normal limits in parenchymal echogenicity. Portal vein is patent on color Doppler imaging with normal direction of blood flow towards the liver. Other: None. IMPRESSION: 1. No acute sonographic findings in the right upper quadrant of the abdomen. 2. There is a 2.6 x 1.9 x 2.3 cm cyst in the right lobe of the liver as seen on recent CT. Electronically Signed   By: ARonney AstersM.D.   On: 07/23/2022 15:31   CT ABDOMEN PELVIS W CONTRAST  Result Date: 07/23/2022 CLINICAL DATA:  Left upper quadrant abdominal pain EXAM: CT ABDOMEN AND PELVIS WITH CONTRAST TECHNIQUE: Multidetector CT imaging of the abdomen and pelvis was performed using the standard protocol following bolus administration of intravenous contrast. RADIATION DOSE REDUCTION: This exam was performed according to the departmental dose-optimization program which includes automated exposure control, adjustment of the mA and/or kV according to patient size and/or use of iterative reconstruction technique. CONTRAST:  7110mOMNIPAQUE  IOHEXOL 350 MG/ML SOLN COMPARISON:  Prior CT scan of the abdomen and pelvis 02/24/2015 FINDINGS: Lower chest: No acute abnormality. Hepatobiliary: Normal hepatic contour morphology. No discrete hepatic lesion. Circumscribed water attenuation cysts have decreased in size compared to prior imaging. No solid lesion. Mild gallbladder distension without inflammatory change or visible cholelithiasis. No intra or extrahepatic biliary ductal dilatation. Pancreas: Unremarkable. No pancreatic ductal dilatation or surrounding inflammatory changes. Spleen: Normal in size without focal abnormality. Adrenals/Urinary Tract: Normal adrenal glands. Small circumscribed low-attenuation lesions present within the renal hila bilaterally and scattered throughout the left kidney. While too small to characterize, these are almost certainly benign renal sinus cysts and cortical renal cysts. No imaging follow-up recommended. No hydronephrosis, nephrolithiasis or enhancing renal mass. The ureters and bladder are unremarkable. Stomach/Bowel: Unremarkable appearance of the stomach. Diverticulum arising from the superior aspect of the horizontal duodenum. Normal appendix in the right lower quadrant. No evidence of bowel obstruction. No wall thickening or inflammatory changes. There are a few scattered colonic diverticula. Vascular/Lymphatic: Scattered atherosclerotic calcifications throughout the abdominal aorta. No evidence of aneurysm or dissection. No suspicious lymphadenopathy. Reproductive: Uterus and bilateral adnexa are unremarkable. Other: No abdominal wall hernia or abnormality. No abdominopelvic ascites. Musculoskeletal: No acute fracture  or aggressive appearing lytic or blastic osseous lesion. Multilevel degenerative disc disease. IMPRESSION: 1. No acute abnormality within the abdomen or pelvis. 2. Mild gallbladder distension without inflammatory change or radiopaque cholelithiasis. 3. Scattered colonic diverticula without evidence of  active inflammation. 4. Multilevel degenerative disc disease. 5.  Aortic Atherosclerosis (ICD10-I70.0). Electronically Signed   By: Jacqulynn Cadet M.D.   On: 07/23/2022 08:58    Anti-infectives: Anti-infectives (From admission, onward)    None        Assessment/Plan Epigastric Abdominal Pain - Patient with intermittent episodes of upper abdominal pain with radiation to her back, worse in the evening, for the last 2-3 years. Has undergone cardiac w/u and EGD without clear etiology.  - RUQ [place] without gallstones  - CT w/ mild gallbladder distension w/o inflammatory changes or radiopaque cholelithiasis  - She is afebrile without tachycardia or hypotension. Her wbc is wnl. Lipase wnl. Does have mild elevation of AST (68) and ALT (77) but normal T. Bili - HIDA negative for cholecystitis  - patients symptoms are consistent with biliary colic/gallbladder disease, despite negative imaging. Patient would like to consider empiric cholecystectomy as an outpatient, which I think is reasonable. I will arrange follow up with Dr. Kae Heller. Stable for discharge from [facility] standpoint.   FEN - Reg diet  VTE - SCDs, subq heparin ID - None  HTN HLD GERD Hyperthyroidism   I reviewed nursing notes, hospitalist notes, last 24 h vitals and pain scores, last 48 h intake and output, last 24 h labs and trends, and last 24 h imaging results.    LOS: 0 days    Jill Alexanders , [facility] 07/25/2022, 8:04 AM Please see Amion for pager number during day hours 7:00am-4:30pm

## 2022-07-25 NOTE — Telephone Encounter (Signed)
Called and Lvm for pt to call back and r/s her Sept appt for her Initial Cpap needing to be scheduled 08/22/22-10/20/22

## 2022-07-25 NOTE — Transfer of Care (Signed)
Immediate Anesthesia Transfer of Care Note  Patient: Heather Kirby  Procedure(s) Performed: LAPAROSCOPIC CHOLECYSTECTOMY (Abdomen)  Patient Location: PACU  Anesthesia Type:General  Level of Consciousness: awake and alert   Airway & Oxygen Therapy: Patient Spontanous Breathing and Patient connected to nasal cannula oxygen  Post-op Assessment: Report given to RN and Post -op Vital signs reviewed and stable  Post vital signs: Reviewed and stable  Last Vitals:  Vitals Value Taken Time  BP 154/84 07/25/22 1420  Temp    Pulse 63 07/25/22 1422  Resp 20 07/25/22 1422  SpO2 100 % 07/25/22 1422  Vitals shown include unvalidated device data.  Last Pain:  Vitals:   07/25/22 1312  TempSrc:   PainSc: 0-No pain         Complications: No notable events documented.

## 2022-07-25 NOTE — Op Note (Signed)
  07/23/2022 - 07/25/2022  2:05 PM  PATIENT:  Heather Kirby  77 y.o. female  PRE-OPERATIVE DIAGNOSIS:  biliary colic  POST-OPERATIVE DIAGNOSIS:  chronic cholecystitis  PROCEDURE:  Procedure(s): LAPAROSCOPIC CHOLECYSTECTOMY  SURGEON:  Surgeon(s): Georganna Skeans, MD  ASSISTANTS: Margie Billet, PA-C   ANESTHESIA:   local and general  EBL:  Total I/O In: 600 [I.V.:600] Out: 25 [Blood:25]  BLOOD ADMINISTERED:none  DRAINS: none   SPECIMEN:  Excision  DISPOSITION OF SPECIMEN:  PATHOLOGY  COUNTS:  YES  DICTATION: .Dragon Dictation Findings: Evidence of chronic cholecystitis  Procedure in detail: Informed consent was obtained.  She received intravenous antibiotics.  She was brought to the operating room and general endotracheal anesthesia was administered by the anesthesia staff.  Her abdomen was prepped and draped in a sterile fashion.  We did a timeout procedure.The infraumbilical region was infiltrated with local. Infraumbilical incision was made. Subcutaneous tissues were dissected down revealing the anterior fascia. This was divided sharply along the midline. Peritoneal cavity was entered under direct vision without complication. A 0 Vicryl pursestring was placed around the fascial opening. Hassan trocar was inserted into the abdomen. The abdomen was insufflated with carbon dioxide in standard fashion. Under direct vision a 5 mm epigastric and 5 mm right abdominal port x 2 were placed.  Local was used at each port site.  Laparoscopic exploration revealed evidence of chronic cholecystitis as the omentum was stuck up along the body and infundibulum of the gallbladder.  The dome of the gallbladder was retracted superior and medially.  The omentum was gradually dissected off of the gallbladder using cautery.  Once the infundibulum was freed up, it was retracted inferior and laterally.  Further dissection cleared away the omentum.  Dissection was then began laterally at the infundibulum and  progressed medially identifying the cystic duct and cystic artery.  Cystic duct was dissected until we had a critical view of safety.  3 clips were placed proximally on the cystic duct, 1 was placed distally and it was divided.  2 clips were placed proximally the cystic artery and 1 was placed distally.  It was divided.  The gallbladder was taken off the liver bed using cautery and achieving excellent hemostasis along the way.  The gallbladder was placed in a bag and removed from the abdomen.  It was sent to pathology.  The liver bed was then irrigated.  Hemostasis was ensured with cautery.  Clips remain in good position.  Liver bed was dry.  Irrigation fluid was evacuated.  Ports were then removed under direct vision.  Pneumoperitoneum was released.  The infraumbilical fascia was closed by tying the pursestring.  All 4 wounds were irrigated and the skin of each was closed with 4-0 Vicryl followed by Dermabond.  All counts were correct.  She tolerated the procedure well without apparent complication and was taken recovery in stable condition.  PATIENT DISPOSITION:  PACU - hemodynamically stable.   Delay start of Pharmacological VTE agent (>24hrs) due to surgical blood loss or risk of bleeding:  no  Georganna Skeans, MD, MPH, FACS Pager: [phone]  3/11/20242:05 PM

## 2022-07-25 NOTE — Anesthesia Preprocedure Evaluation (Signed)
Anesthesia Evaluation  Patient identified by MRN, date of birth, ID band Patient awake    Reviewed: Allergy & Precautions, H&P , NPO status , Patient's Chart, lab work & pertinent test results  Airway Mallampati: II   Neck ROM: full    Dental   Pulmonary sleep apnea    breath sounds clear to auscultation       Cardiovascular negative cardio ROS  Rhythm:regular Rate:Normal     Neuro/Psych    GI/Hepatic ,GERD  ,,  Endo/Other   Hyperthyroidism   Renal/GU      Musculoskeletal  (+) Arthritis ,    Abdominal   Peds  Hematology   Anesthesia Other Findings   Reproductive/Obstetrics                             Anesthesia Physical Anesthesia Plan  ASA: 2  Anesthesia Plan: General   Post-op Pain Management:    Induction: Intravenous  PONV Risk Score and Plan: 3 and Ondansetron, Dexamethasone and Treatment may vary due to age or medical condition  Airway Management Planned: Oral ETT  Additional Equipment:   Intra-op Plan:   Post-operative Plan: Extubation in OR  Informed Consent: I have reviewed the patients History and Physical, chart, labs and discussed the procedure including the risks, benefits and alternatives for the proposed anesthesia with the patient or authorized representative who has indicated his/her understanding and acceptance.     Dental advisory given  Plan Discussed with: CRNA, Anesthesiologist and Surgeon  Anesthesia Plan Comments:        Anesthesia Quick Evaluation

## 2022-07-25 NOTE — Progress Notes (Signed)
PROGRESS NOTE    Heather Kirby  [id number] DOB: 1945-09-02 DOA: 07/23/2022 PCP: Donnajean Lopes, MD   Brief Narrative:  77 y/o with HTn, GERD, hyperthyroidism presented with upper abdominal pain starting after she had have a burger and later a muffin.No associated nausea or diarrhea.  Patient underwent extensive workup including right upper quadrant ultrasound, CT abdomen pelvis and HIDA scan which were all negative.  Eventually it was decided to perform cholecystectomy.   Assessment & Plan:  Principal Problem:   Acute epigastric pain Active Problems:   Hyperthyroidism   GERD (gastroesophageal reflux disease)    Acute epigastric abdominal pain -LFTs and rest of the workup including CT abdomen pelvis, HIDA and right upper quadrant ultrasound has been negative.  Given the symptomatology it was decided to perform laparoscopic cholecystectomy by general surgery.     Hyperthyroidism -Continue home medication    DVT prophylaxis: Subcu heparin Code Status: Full code Family Communication: Spouse at bedside  Continue hospital stay for surgical evaluation.  Subjective: Seen and examined at side.  Still having right upper quadrant abdominal pain.  Patient tells me this comes in waves and sometimes it has very severe.   Examination:  General exam: Appears calm and comfortable  Respiratory system: Clear to auscultation. Respiratory effort normal. Cardiovascular system: S1 & S2 heard, RRR. No JVD, murmurs, rubs, gallops or clicks. No pedal edema. Gastrointestinal system: Abdomen is nondistended, soft and nontender. No organomegaly or masses felt. Normal bowel sounds heard. Central nervous system: Alert and oriented. No focal neurological deficits. Extremities: Symmetric 5 x 5 power. Skin: No rashes, lesions or ulcers Psychiatry: Judgement and insight appear normal. Mood & affect appropriate.     Objective: Vitals:   07/24/22 1934 07/25/22 0508 07/25/22 0753 07/25/22 1245   BP: 127/76 132/71 (!) 144/83 126/81  Pulse: 83 64 61 69  Resp: '16 17 18 19  '$ Temp: (!) 97.5 F (36.4 C) 97.6 F (36.4 C) (!) 97.3 F (36.3 C) 98.7 F (37.1 C)  TempSrc: Oral Oral Oral   SpO2: 99% 100% 100% 98%  Weight:      Height:    '5\' 6"'$  (1.676 m)    Intake/Output Summary (Last 24 hours) at 07/25/2022 1250 Last data filed at 07/25/2022 1100 Gross per 24 hour  Intake 0 ml  Output --  Net 0 ml   Filed Weights   07/23/22 0557  Weight: 62.8 kg     Data Reviewed:   CBC: Recent Labs  Lab 07/23/22 0607 07/24/22 0431 07/25/22 0859  WBC 12.7* 5.3 5.0  NEUTROABS 10.9*  --   --   HGB 14.2 14.3 14.9  HCT 41.8 41.9 44.1  MCV 88.9 89.5 88.9  PLT 109* 120* 123XX123*   Basic Metabolic Panel: Recent Labs  Lab 07/23/22 0607  NA 139  K 4.4  CL 105  CO2 23  GLUCOSE 101*  BUN 20  CREATININE 1.16*  CALCIUM 9.6   GFR: Estimated Creatinine Clearance: 38 mL/min (A) (by C-G formula based on SCr of 1.16 mg/dL (H)). Liver Function Tests: Recent Labs  Lab 07/23/22 0607 07/24/22 0431  AST 63* 68*  ALT 29 77*  ALKPHOS 80 77  BILITOT 0.8 0.8  PROT 6.4* 6.4*  ALBUMIN 3.9 3.7   Recent Labs  Lab 07/23/22 0607  LIPASE 37   No results for input(s): "AMMONIA" in the last 168 hours. Coagulation Profile: No results for input(s): "INR", "PROTIME" in the last 168 hours. Cardiac Enzymes: No results for input(s): "CKTOTAL", "CKMB", "  CKMBINDEX", "TROPONINI" in the last 168 hours. BNP (last 3 results) No results for input(s): "PROBNP" in the last 8760 hours. HbA1C: No results for input(s): "HGBA1C" in the last 72 hours. CBG: No results for input(s): "GLUCAP" in the last 168 hours. Lipid Profile: No results for input(s): "CHOL", "HDL", "LDLCALC", "TRIG", "CHOLHDL", "LDLDIRECT" in the last 72 hours. Thyroid Function Tests: No results for input(s): "TSH", "T4TOTAL", "FREET4", "T3FREE", "THYROIDAB" in the last 72 hours. Anemia Panel: No results for input(s): "VITAMINB12",  "FOLATE", "FERRITIN", "TIBC", "IRON", "RETICCTPCT" in the last 72 hours. Sepsis Labs: No results for input(s): "PROCALCITON", "LATICACIDVEN" in the last 168 hours.  No results found for this or any previous visit (from the past 240 hour(s)).       Radiology Studies: MR THORACIC SPINE WO CONTRAST  Result Date: 07/24/2022 CLINICAL DATA:  Mid back pain EXAM: MRI THORACIC SPINE WITHOUT CONTRAST TECHNIQUE: Multiplanar, multisequence MR imaging of the thoracic spine was performed. No intravenous contrast was administered. COMPARISON:  None Available. FINDINGS: Alignment:  Physiologic. Vertebrae: T7 hemangioma.  No acute abnormality. Cord: Spinal cord is normal. At the T9-10 level, there is a dorsal collection measuring 6 mm AP and 38 mm cc. Paraspinal and other soft tissues: Negative. Disc levels: T3-4: Small disc bulge without stenosis. T7-8: Small central disc protrusion without stenosis. T8-9: Small central disc protrusion without stenosis. T9-10: Small right subarticular disc protrusion without stenosis. T10-11: Small right subarticular disc protrusion without stenosis. The other thoracic levels are normal. IMPRESSION: 1. 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 2. Mild thoracic degenerative disc disease without stenosis. Electronically Signed   By: Ulyses Jarred M.D.   On: 07/24/2022 23:01   NM Hepatobiliary Liver Func  Result Date: 07/24/2022 CLINICAL DATA:  77 year old female with upper abdominal pain. Unrevealing right upper quadrant ultrasound, CT Abdomen and Pelvis. EXAM: NUCLEAR MEDICINE HEPATOBILIARY IMAGING TECHNIQUE: Sequential images of the abdomen were obtained out to 60 minutes following intravenous administration of radiopharmaceutical. RADIOPHARMACEUTICALS:  5.5 mCi Tc-66m Choletec IV COMPARISON:  CT Abdomen and Pelvis and ultrasound yesterday. FINDINGS: Prompt radiotracer uptake by the liver and clearance of the blood  pool. Immediate CBD activity, and early gallbladder activity which is visible within 10-15 minutes. Both gallbladder and small bowel activity increase over the course of the exam. No abnormal radiotracer accumulation identified. IMPRESSION: Normal hepatobiliary scan, patent CBD and cystic duct. Electronically Signed   By: HGenevie AnnM.D.   On: 07/24/2022 11:50   UKoreaAbdomen Limited RUQ (LIVER/GB)  Result Date: 07/23/2022 CLINICAL DATA:  Biliary colic EXAM: ULTRASOUND ABDOMEN LIMITED RIGHT UPPER QUADRANT COMPARISON:  CT abdomen and pelvis 07/23/2022 FINDINGS: Gallbladder: No gallstones or wall thickening visualized. No sonographic Murphy sign noted by sonographer. Common bile duct: Diameter: 3.7 mm Liver: There is a 2.6 x 1.9 x 2.3 cm cyst in the right lobe of the liver as seen on recent CT. Within normal limits in parenchymal echogenicity. Portal vein is patent on color Doppler imaging with normal direction of blood flow towards the liver. Other: None. IMPRESSION: 1. No acute sonographic findings in the right upper quadrant of the abdomen. 2. There is a 2.6 x 1.9 x 2.3 cm cyst in the right lobe of the liver as seen on recent CT. Electronically Signed   By: ARonney AstersM.D.   On: 07/23/2022 15:31        Scheduled Meds:  [MAR Hold] heparin  5,000 Units Subcutaneous  Q12H   [MAR Hold] methimazole  2.5 mg Oral BID   [MAR Hold] pantoprazole  40 mg Oral Daily   [MAR Hold] simethicone  160 mg Oral TID AC & HS   [MAR Hold] simvastatin  40 mg Oral Daily   Continuous Infusions:  [MAR Hold] cefTRIAXone (ROCEPHIN)  IV       LOS: 0 days   Time spent= 35 mins    Kaladin Noseworthy Arsenio Loader, MD [facility]  If 7PM-7AM, please contact night-coverage  07/25/2022, 12:50 PM

## 2022-07-25 NOTE — Progress Notes (Signed)
Pt refusing CPAP for the night.  

## 2022-07-25 NOTE — Discharge Instructions (Signed)
[facility] LAPAROSCOPIC SURGERY: POST OP INSTRUCTIONS Always review your discharge instruction sheet given to you by the facility where your surgery was performed. IF YOU HAVE DISABILITY OR FAMILY LEAVE FORMS, YOU MUST BRING THEM TO THE OFFICE FOR PROCESSING.   DO NOT GIVE THEM TO YOUR DOCTOR.  PAIN CONTROL  First take acetaminophen (Tylenol) AND/or ibuprofen (Advil) to control your pain after surgery.  Follow directions on package.  Taking acetaminophen (Tylenol) and/or ibuprofen (Advil) regularly after surgery will help to control your pain and lower the amount of prescription pain medication you may need.  You should not take more than 3,000 mg (3 grams) of acetaminophen (Tylenol) in 24 hours.  You should not take ibuprofen (Advil), aleve, motrin, naprosyn or other NSAIDS if you have a history of stomach ulcers or chronic kidney disease.  A prescription for pain medication may be given to you upon discharge.  Take your pain medication as prescribed, if you still have uncontrolled pain after taking acetaminophen (Tylenol) or ibuprofen (Advil). Use ice packs to help control pain. If you need a refill on your pain medication, please contact your pharmacy.  They will contact our office to request authorization. Prescriptions will not be filled after 5pm or on week-ends.  HOME MEDICATIONS Take your usually prescribed medications unless otherwise directed.  DIET You should follow a light diet the first few days after arrival home.  Be sure to include lots of fluids daily. Avoid fatty, fried foods.   CONSTIPATION It is common to experience some constipation after surgery and if you are taking pain medication.  Increasing fluid intake and taking a stool softener (such as Colace) will usually help or prevent this problem from occurring.  A mild laxative (Milk of Magnesia or Miralax) should be taken according to package instructions if there are no bowel movements after 48  hours.  WOUND/INCISION CARE Most patients will experience some swelling and bruising in the area of the incisions.  Ice packs will help.  Swelling and bruising can take several days to resolve.  Unless discharge instructions indicate otherwise, follow guidelines below  STERI-STRIPS - you may remove your outer bandages 48 hours after surgery, and you may shower at that time.  You have steri-strips (small skin tapes) in place directly over the incision.  These strips should be left on the skin for 7-10 days.   DERMABOND/SKIN GLUE - you may shower in 24 hours.  The glue will flake off over the next 2-3 weeks. Any sutures or staples will be removed at the office during your follow-up visit.  ACTIVITIES You may resume regular (light) daily activities beginning the next day--such as daily self-care, walking, climbing stairs--gradually increasing activities as tolerated.  You may have sexual intercourse when it is comfortable.  Refrain from any heavy lifting or straining until approved by your doctor. You may drive when you are no longer taking prescription pain medication, you can comfortably wear a seatbelt, and you can safely maneuver your car and apply brakes.  FOLLOW-UP You should see your doctor in the office for a follow-up appointment approximately 2-3 weeks after your surgery.  You should have been given your post-op/follow-up appointment when your surgery was scheduled.  If you did not receive a post-op/follow-up appointment, make sure that you call for this appointment within a day or two after you arrive home to insure a convenient appointment time.   WHEN TO CALL YOUR DOCTOR: Fever over 101.0 Inability to urinate Continued bleeding from incision.   Increased pain, redness, or drainage from the incision. Increasing abdominal pain  The clinic staff is available to answer your questions during regular business hours.  Please don't hesitate to call and ask to speak to one of the nurses for  clinical concerns.  If you have a medical emergency, go to the nearest emergency room or call 911.  A surgeon from [facility] is always on call at the hospital. [address], [address], [place], [place]  [address] ? [address] [address], [place], [place]   [address] [phone] ? [phone] ? FAX [phone] Web site: [email or web address]  

## 2022-07-26 ENCOUNTER — Encounter (HOSPITAL_COMMUNITY): Payer: Self-pay | Admitting: General Surgery

## 2022-07-26 ENCOUNTER — Other Ambulatory Visit (HOSPITAL_COMMUNITY): Payer: Self-pay

## 2022-07-26 DIAGNOSIS — D72829 Elevated white blood cell count, unspecified: Secondary | ICD-10-CM | POA: Diagnosis present

## 2022-07-26 DIAGNOSIS — Z79899 Other long term (current) drug therapy: Secondary | ICD-10-CM | POA: Diagnosis not present

## 2022-07-26 DIAGNOSIS — M25511 Pain in right shoulder: Secondary | ICD-10-CM | POA: Diagnosis not present

## 2022-07-26 DIAGNOSIS — E0590 Thyrotoxicosis, unspecified without thyrotoxic crisis or storm: Secondary | ICD-10-CM | POA: Diagnosis present

## 2022-07-26 DIAGNOSIS — Z9841 Cataract extraction status, right eye: Secondary | ICD-10-CM | POA: Diagnosis not present

## 2022-07-26 DIAGNOSIS — E039 Hypothyroidism, unspecified: Secondary | ICD-10-CM | POA: Diagnosis present

## 2022-07-26 DIAGNOSIS — N289 Disorder of kidney and ureter, unspecified: Secondary | ICD-10-CM | POA: Diagnosis present

## 2022-07-26 DIAGNOSIS — Z7982 Long term (current) use of aspirin: Secondary | ICD-10-CM | POA: Diagnosis not present

## 2022-07-26 DIAGNOSIS — Z961 Presence of intraocular lens: Secondary | ICD-10-CM | POA: Diagnosis present

## 2022-07-26 DIAGNOSIS — K8010 Calculus of gallbladder with chronic cholecystitis without obstruction: Secondary | ICD-10-CM | POA: Diagnosis present

## 2022-07-26 DIAGNOSIS — R1013 Epigastric pain: Secondary | ICD-10-CM | POA: Diagnosis not present

## 2022-07-26 DIAGNOSIS — Z833 Family history of diabetes mellitus: Secondary | ICD-10-CM | POA: Diagnosis not present

## 2022-07-26 DIAGNOSIS — Z8249 Family history of ischemic heart disease and other diseases of the circulatory system: Secondary | ICD-10-CM | POA: Diagnosis not present

## 2022-07-26 DIAGNOSIS — G4733 Obstructive sleep apnea (adult) (pediatric): Secondary | ICD-10-CM | POA: Diagnosis present

## 2022-07-26 DIAGNOSIS — R7401 Elevation of levels of liver transaminase levels: Secondary | ICD-10-CM | POA: Diagnosis present

## 2022-07-26 DIAGNOSIS — Z888 Allergy status to other drugs, medicaments and biological substances status: Secondary | ICD-10-CM | POA: Diagnosis not present

## 2022-07-26 DIAGNOSIS — D696 Thrombocytopenia, unspecified: Secondary | ICD-10-CM | POA: Diagnosis present

## 2022-07-26 DIAGNOSIS — K219 Gastro-esophageal reflux disease without esophagitis: Secondary | ICD-10-CM | POA: Diagnosis present

## 2022-07-26 DIAGNOSIS — Z803 Family history of malignant neoplasm of breast: Secondary | ICD-10-CM | POA: Diagnosis not present

## 2022-07-26 DIAGNOSIS — R030 Elevated blood-pressure reading, without diagnosis of hypertension: Secondary | ICD-10-CM | POA: Diagnosis present

## 2022-07-26 DIAGNOSIS — I10 Essential (primary) hypertension: Secondary | ICD-10-CM | POA: Diagnosis present

## 2022-07-26 DIAGNOSIS — Z9842 Cataract extraction status, left eye: Secondary | ICD-10-CM | POA: Diagnosis not present

## 2022-07-26 DIAGNOSIS — E785 Hyperlipidemia, unspecified: Secondary | ICD-10-CM | POA: Diagnosis present

## 2022-07-26 DIAGNOSIS — R109 Unspecified abdominal pain: Secondary | ICD-10-CM | POA: Diagnosis present

## 2022-07-26 LAB — GLUCOSE, CAPILLARY: Glucose-Capillary: 129 mg/dL — ABNORMAL HIGH (ref 70–99)

## 2022-07-26 LAB — COMPREHENSIVE METABOLIC PANEL
ALT: 65 U/L — ABNORMAL HIGH (ref 0–44)
AST: 71 U/L — ABNORMAL HIGH (ref 15–41)
Albumin: 3.4 g/dL — ABNORMAL LOW (ref 3.5–5.0)
Alkaline Phosphatase: 63 U/L (ref 38–126)
Anion gap: 10 (ref 5–15)
BUN: 14 mg/dL (ref 8–23)
CO2: 22 mmol/L (ref 22–32)
Calcium: 8.9 mg/dL (ref 8.9–10.3)
Chloride: 103 mmol/L (ref 98–111)
Creatinine, Ser: 1.05 mg/dL — ABNORMAL HIGH (ref 0.44–1.00)
GFR, Estimated: 55 mL/min — ABNORMAL LOW (ref >60)
Glucose, Bld: 126 mg/dL — ABNORMAL HIGH (ref 70–99)
Potassium: 4.5 mmol/L (ref 3.5–5.1)
Sodium: 135 mmol/L (ref 135–145)
Total Bilirubin: 1 mg/dL (ref 0.3–1.2)
Total Protein: 6.1 g/dL — ABNORMAL LOW (ref 6.5–8.1)

## 2022-07-26 LAB — SURGICAL PATHOLOGY

## 2022-07-26 LAB — MAGNESIUM: Magnesium: 2 mg/dL (ref 1.7–2.4)

## 2022-07-26 MED ORDER — ACETAMINOPHEN 325 MG PO TABS: 650.0000 mg | ORAL_TABLET | Freq: Four times a day (QID) | ORAL | Status: DC | PRN

## 2022-07-26 MED ORDER — SENNOSIDES-DOCUSATE SODIUM 8.6-50 MG PO TABS
1.0000 | ORAL_TABLET | Freq: Every evening | ORAL | 0 refills | Status: DC | PRN
  Filled 2022-07-26: qty 30, 30d supply, fill #0

## 2022-07-26 MED ORDER — TRAMADOL HCL 50 MG PO TABS
50.0000 mg | ORAL_TABLET | ORAL | 0 refills | Status: AC | PRN
  Filled 2022-07-26: qty 15, 3d supply, fill #0

## 2022-07-26 MED ORDER — SIMETHICONE 80 MG PO CHEW: 160.0000 mg | CHEWABLE_TABLET | Freq: Four times a day (QID) | ORAL | Status: DC | PRN

## 2022-07-26 NOTE — Discharge Summary (Signed)
Physician Discharge Summary  Heather Kirby [id number] DOB: Jun 10, 1945 DOA: 07/23/2022  PCP: Donnajean Lopes, MD  Admit date: 07/23/2022 Discharge date: 07/26/2022  Admitted From: Home Disposition: Home  Recommendations for Outpatient Follow-up:  Follow up with PCP in 1-2 weeks Please obtain BMP/CBC in one week your next doctors visit.  Pain medication prescribed by general surgery.  Outpatient to be arranged by their service Bowel regimen prescribed   Discharge Condition: Stable CODE STATUS: Full code Diet recommendation: Regular  Brief/Interim Summary: 77 y/o with HTn, GERD, hyperthyroidism presented with upper abdominal pain starting after she had have a burger and later a muffin.No associated nausea or diarrhea.  Patient underwent extensive workup including right upper quadrant ultrasound, CT abdomen pelvis and HIDA scan which were all negative.  Eventually it was decided to perform cholecystectomy.  There was some evidence of chronic cholecystitis, path was sent which will be followed up by outpatient general surgery Patient tolerated laparoscopic cecectomy well on 3/11.  Following day she was tolerating oral and stable for discharge.  She was cleared by general surgery.    Discharge Diagnoses:  Principal Problem:   Acute epigastric pain Active Problems:   Hyperthyroidism   GERD (gastroesophageal reflux disease)   Abdominal pain      Consultations: General surgery  Subjective: Seen and examined at bedside, no complaints this morning.  Discharge Exam: Vitals:   07/26/22 0409 07/26/22 0730  BP: 113/74 (!) 169/82  Pulse: 77 67  Resp: 16 17  Temp: 98.4 F (36.9 C) 98.2 F (36.8 C)  SpO2: 97% 97%   Vitals:   07/25/22 1600 07/25/22 1900 07/26/22 0409 07/26/22 0730  BP: (!) 156/82 (!) 142/86 113/74 (!) 169/82  Pulse:  82 77 67  Resp:  '18 16 17  '$ Temp:  97.7 F (36.5 C) 98.4 F (36.9 C) 98.2 F (36.8 C)  TempSrc:  Oral Oral Oral  SpO2:  94% 97% 97%   Weight:      Height:        General: Pt is alert, awake, not in acute distress Cardiovascular: RRR, S1/S2 +, no rubs, no gallops Respiratory: CTA bilaterally, no wheezing, no rhonchi Abdominal: Soft, NT, ND, bowel sounds + Extremities: no edema, no cyanosis  Discharge Instructions   Allergies as of 07/26/2022       Reactions   Alendronate Sodium    Other reaction(s): GI upset   Gabapentin    Other reaction(s): Severe Dry Mouth        Medication List     TAKE these medications    acetaminophen 325 MG tablet Commonly known as: TYLENOL Take 2 tablets (650 mg total) by mouth every 6 (six) hours as needed for mild pain or fever.   CALCIUM 1200 PO Take 1 tablet by mouth every evening.   diphenhydramine-acetaminophen 25-500 MG Tabs tablet Commonly known as: TYLENOL PM Take 1 tablet by mouth at bedtime.   hyoscyamine 0.125 MG SL tablet Commonly known as: LEVSIN SL 1-2 sublingually every 4 hours as need for pain   methimazole 5 MG tablet Commonly known as: TAPAZOLE Take 2.5 mg by mouth 2 (two) times daily.   omeprazole 40 MG capsule Commonly known as: PRILOSEC Take 40 mg by mouth daily.   senna-docusate 8.6-50 MG tablet Commonly known as: Senokot-S Take 1 tablet by mouth at bedtime as needed for moderate constipation.   simethicone 80 MG chewable tablet Commonly known as: MYLICON Chew 2 tablets (160 mg total) by mouth 4 (four) times daily as  needed for flatulence.   simvastatin 40 MG tablet Commonly known as: ZOCOR Take 40 mg by mouth every evening.   traMADol 50 MG tablet Commonly known as: ULTRAM Take 1 tablet (50 mg total) by mouth every 4 (four) hours as needed for moderate pain or severe pain. What changed:  when to take this reasons to take this        Follow-up Information     Maczis, Carlena Hurl, PA-C. Go on 08/16/2022.   Specialty: General Surgery Why: at 11:00 AM for post-operative follow up. please arrive 20-30 minutes early. Contact  information: [address] [place] [address] [phone]         Donnajean Lopes, MD Follow up.   Specialty: Internal Medicine Why: Please schedule a hospital follow up with the primary care office in the next 7-10 days. Contact information: [address] [place] [address] [phone]         Donnajean Lopes, MD .   Specialty: Internal Medicine Contact information: [address] [place] [address] [phone]                Allergies  Allergen Reactions   Alendronate Sodium     Other reaction(s): GI upset   Gabapentin     Other reaction(s): Severe Dry Mouth    You were cared for by a hospitalist during your hospital stay. If you have any questions about your discharge medications or the care you received while you were in the hospital after you are discharged, you can call the unit and asked to speak with the hospitalist on call if the hospitalist that took care of you is not available. Once you are discharged, your primary care physician will handle any further medical issues. Please note that no refills for any discharge medications will be authorized once you are discharged, as it is imperative that you return to your primary care physician (or establish a relationship with a primary care physician if you do not have one) for your aftercare needs so that they can reassess your need for medications and monitor your lab values.  You were cared for by a hospitalist during your hospital stay. If you have any questions about your discharge medications or the care you received while you were in the hospital after you are discharged, you can call the unit and asked to speak with the hospitalist on call if the hospitalist that took care of you is not available. Once you are discharged, your primary care physician will handle any further medical issues. Please note that NO REFILLS for any discharge medications will be authorized once you are  discharged, as it is imperative that you return to your primary care physician (or establish a relationship with a primary care physician if you do not have one) for your aftercare needs so that they can reassess your need for medications and monitor your lab values.  Please request your Prim.MD to go over all Hospital Tests and Procedure/Radiological results at the follow up, please get all Hospital records sent to your Prim MD by signing hospital release before you go home.  Get CBC, CMP, 2 view Chest X ray checked  by Primary MD during your next visit or SNF MD in 5-7 days ( we routinely change or add medications that can affect your baseline labs and fluid status, therefore we recommend that you get the mentioned basic workup next visit with your PCP, your PCP may decide not to get  them or add new tests based on their clinical decision)  On your next visit with your primary care physician please Get Medicines reviewed and adjusted.  If you experience worsening of your admission symptoms, develop shortness of breath, life threatening emergency, suicidal or homicidal thoughts you must seek medical attention immediately by calling 911 or calling your MD immediately  if symptoms less severe.  You Must read complete instructions/literature along with all the possible adverse reactions/side effects for all the Medicines you take and that have been prescribed to you. Take any new Medicines after you have completely understood and accpet all the possible adverse reactions/side effects.   Do not drive, operate heavy machinery, perform activities at heights, swimming or participation in water activities or provide baby sitting services if your were admitted for syncope or siezures until you have seen by Primary MD or a Neurologist and advised to do so again.  Do not drive when taking Pain medications.   Procedures/Studies: MR THORACIC SPINE WO CONTRAST  Result Date: 07/24/2022 CLINICAL DATA:  Mid back  pain EXAM: MRI THORACIC SPINE WITHOUT CONTRAST TECHNIQUE: Multiplanar, multisequence MR imaging of the thoracic spine was performed. No intravenous contrast was administered. COMPARISON:  None Available. FINDINGS: Alignment:  Physiologic. Vertebrae: T7 hemangioma.  No acute abnormality. Cord: Spinal cord is normal. At the T9-10 level, there is a dorsal collection measuring 6 mm AP and 38 mm cc. Paraspinal and other soft tissues: Negative. Disc levels: T3-4: Small disc bulge without stenosis. T7-8: Small central disc protrusion without stenosis. T8-9: Small central disc protrusion without stenosis. T9-10: Small right subarticular disc protrusion without stenosis. T10-11: Small right subarticular disc protrusion without stenosis. The other thoracic levels are normal. IMPRESSION: 1. 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 2. Mild thoracic degenerative disc disease without stenosis. Electronically Signed   By: Ulyses Jarred M.D.   On: 07/24/2022 23:01   NM Hepatobiliary Liver Func  Result Date: 07/24/2022 CLINICAL DATA:  77 year old female with upper abdominal pain. Unrevealing right upper quadrant ultrasound, CT Abdomen and Pelvis. EXAM: NUCLEAR MEDICINE HEPATOBILIARY IMAGING TECHNIQUE: Sequential images of the abdomen were obtained out to 60 minutes following intravenous administration of radiopharmaceutical. RADIOPHARMACEUTICALS:  5.5 mCi Tc-31m Choletec IV COMPARISON:  CT Abdomen and Pelvis and ultrasound yesterday. FINDINGS: Prompt radiotracer uptake by the liver and clearance of the blood pool. Immediate CBD activity, and early gallbladder activity which is visible within 10-15 minutes. Both gallbladder and small bowel activity increase over the course of the exam. No abnormal radiotracer accumulation identified. IMPRESSION: Normal hepatobiliary scan, patent CBD and cystic duct. Electronically Signed   By: HGenevie AnnM.D.   On: 07/24/2022 11:50    UKoreaAbdomen Limited RUQ (LIVER/GB)  Result Date: 07/23/2022 CLINICAL DATA:  Biliary colic EXAM: ULTRASOUND ABDOMEN LIMITED RIGHT UPPER QUADRANT COMPARISON:  CT abdomen and pelvis 07/23/2022 FINDINGS: Gallbladder: No gallstones or wall thickening visualized. No sonographic Murphy sign noted by sonographer. Common bile duct: Diameter: 3.7 mm Liver: There is a 2.6 x 1.9 x 2.3 cm cyst in the right lobe of the liver as seen on recent CT. Within normal limits in parenchymal echogenicity. Portal vein is patent on color Doppler imaging with normal direction of blood flow towards the liver. Other: None. IMPRESSION: 1. No acute sonographic findings in the right upper quadrant of the abdomen. 2. There is a 2.6 x 1.9 x 2.3 cm cyst in the right lobe of the  liver as seen on recent CT. Electronically Signed   By: Ronney Asters M.D.   On: 07/23/2022 15:31   CT ABDOMEN PELVIS W CONTRAST  Result Date: 07/23/2022 CLINICAL DATA:  Left upper quadrant abdominal pain EXAM: CT ABDOMEN AND PELVIS WITH CONTRAST TECHNIQUE: Multidetector CT imaging of the abdomen and pelvis was performed using the standard protocol following bolus administration of intravenous contrast. RADIATION DOSE REDUCTION: This exam was performed according to the departmental dose-optimization program which includes automated exposure control, adjustment of the mA and/or kV according to patient size and/or use of iterative reconstruction technique. CONTRAST:  70m OMNIPAQUE IOHEXOL 350 MG/ML SOLN COMPARISON:  Prior CT scan of the abdomen and pelvis 02/24/2015 FINDINGS: Lower chest: No acute abnormality. Hepatobiliary: Normal hepatic contour morphology. No discrete hepatic lesion. Circumscribed water attenuation cysts have decreased in size compared to prior imaging. No solid lesion. Mild gallbladder distension without inflammatory change or visible cholelithiasis. No intra or extrahepatic biliary ductal dilatation. Pancreas: Unremarkable. No pancreatic ductal  dilatation or surrounding inflammatory changes. Spleen: Normal in size without focal abnormality. Adrenals/Urinary Tract: Normal adrenal glands. Small circumscribed low-attenuation lesions present within the renal hila bilaterally and scattered throughout the left kidney. While too small to characterize, these are almost certainly benign renal sinus cysts and cortical renal cysts. No imaging follow-up recommended. No hydronephrosis, nephrolithiasis or enhancing renal mass. The ureters and bladder are unremarkable. Stomach/Bowel: Unremarkable appearance of the stomach. Diverticulum arising from the superior aspect of the horizontal duodenum. Normal appendix in the right lower quadrant. No evidence of bowel obstruction. No wall thickening or inflammatory changes. There are a few scattered colonic diverticula. Vascular/Lymphatic: Scattered atherosclerotic calcifications throughout the abdominal aorta. No evidence of aneurysm or dissection. No suspicious lymphadenopathy. Reproductive: Uterus and bilateral adnexa are unremarkable. Other: No abdominal wall hernia or abnormality. No abdominopelvic ascites. Musculoskeletal: No acute fracture or aggressive appearing lytic or blastic osseous lesion. Multilevel degenerative disc disease. IMPRESSION: 1. No acute abnormality within the abdomen or pelvis. 2. Mild gallbladder distension without inflammatory change or radiopaque cholelithiasis. 3. Scattered colonic diverticula without evidence of active inflammation. 4. Multilevel degenerative disc disease. 5.  Aortic Atherosclerosis (ICD10-I70.0). Electronically Signed   By: HJacqulynn CadetM.D.   On: 07/23/2022 08:58     The results of significant diagnostics from this hospitalization (including imaging, microbiology, ancillary and laboratory) are listed below for reference.     Microbiology: No results found for this or any previous visit (from the past 240 hour(s)).   Labs: BNP (last 3 results) No results for  input(s): "BNP" in the last 8760 hours. Basic Metabolic Panel: Recent Labs  Lab 07/23/22 0607 07/26/22 0226  NA 139 135  K 4.4 4.5  CL 105 103  CO2 23 22  GLUCOSE 101* 126*  BUN 20 14  CREATININE 1.16* 1.05*  CALCIUM 9.6 8.9  MG  --  2.0   Liver Function Tests: Recent Labs  Lab 07/23/22 0607 07/24/22 0431 07/26/22 0226  AST 63* 68* 71*  ALT 29 77* 65*  ALKPHOS 80 77 63  BILITOT 0.8 0.8 1.0  PROT 6.4* 6.4* 6.1*  ALBUMIN 3.9 3.7 3.4*   Recent Labs  Lab 07/23/22 0607  LIPASE 37   No results for input(s): "AMMONIA" in the last 168 hours. CBC: Recent Labs  Lab 07/23/22 0607 07/24/22 0431 07/25/22 0859  WBC 12.7* 5.3 5.0  NEUTROABS 10.9*  --   --   HGB 14.2 14.3 14.9  HCT 41.8 41.9 44.1  MCV 88.9 89.5 88.9  PLT 109* 120* 121*   Cardiac Enzymes: No results for input(s): "CKTOTAL", "CKMB", "CKMBINDEX", "TROPONINI" in the last 168 hours. BNP: Invalid input(s): "POCBNP" CBG: Recent Labs  Lab 07/26/22 0406  GLUCAP 129*   D-Dimer No results for input(s): "DDIMER" in the last 72 hours. Hgb A1c No results for input(s): "HGBA1C" in the last 72 hours. Lipid Profile No results for input(s): "CHOL", "HDL", "LDLCALC", "TRIG", "CHOLHDL", "LDLDIRECT" in the last 72 hours. Thyroid function studies No results for input(s): "TSH", "T4TOTAL", "T3FREE", "THYROIDAB" in the last 72 hours.  Invalid input(s): "FREET3" Anemia work up No results for input(s): "VITAMINB12", "FOLATE", "FERRITIN", "TIBC", "IRON", "RETICCTPCT" in the last 72 hours. Urinalysis No results found for: "COLORURINE", "APPEARANCEUR", "LABSPEC", "PHURINE", "GLUCOSEU", "HGBUR", "BILIRUBINUR", "KETONESUR", "PROTEINUR", "UROBILINOGEN", "NITRITE", "LEUKOCYTESUR" Sepsis Labs Recent Labs  Lab 07/23/22 0607 07/24/22 0431 07/25/22 0859  WBC 12.7* 5.3 5.0   Microbiology No results found for this or any previous visit (from the past 240 hour(s)).   Time coordinating discharge:  I have spent 35 minutes  face to face with the patient and on the ward discussing the patients care, assessment, plan and disposition with other care givers. >50% of the time was devoted counseling the patient about the risks and benefits of treatment/Discharge disposition and coordinating care.   SIGNED:   Damita Lack, MD  [facility] 07/26/2022, 11:02 AM   If 7PM-7AM, please contact night-coverage

## 2022-07-26 NOTE — Progress Notes (Addendum)
Mobility Specialist - Progress Note   07/26/22 0940  Mobility  Activity Ambulated with assistance in room  Level of Assistance Standby assist, set-up cues, supervision of patient - no hands on  Assistive Device None  Distance Ambulated (ft) 20 ft  Activity Response Tolerated well  Mobility Referral Yes  $Mobility charge 1 Mobility   Pt was received in bed and agreeable to session. Pt c/o RUE Pain along with some abdominal pain throughout session. Pt was returned to bed with all needs met.   Franki Monte  Mobility Specialist Please contact via Solicitor or Rehab office at [phone]

## 2022-07-26 NOTE — Progress Notes (Signed)
Progress Note  1 Day Post-Op  Subjective: Pt reports some right shoulder pain and abdominal soreness but overall feeling better. Denies nausea or vomiting. Passing some flatus after getting up to the restroom this AM.   Objective: Vital signs in last 24 hours: Temp:  [97.7 F (36.5 C)-98.7 F (37.1 C)] 98.2 F (36.8 C) (03/12 0730) Pulse Rate:  [57-82] 67 (03/12 0730) Resp:  [9-19] 17 (03/12 0730) BP: (113-190)/(61-88) 169/82 (03/12 0730) SpO2:  [94 %-100 %] 97 % (03/12 0730) Last BM Date : 07/22/22  Intake/Output from previous day: 03/11 0701 - 03/12 0700 In: 36 [P.O.:120; I.V.:700] Out: 75 [Blood:75] Intake/Output this shift: No intake/output data recorded.  PE: General: pleasant, WD, WN female who is laying in bed in NAD HEENT: sclera anicteric  Heart: regular, rate, and rhythm.   Lungs:  Respiratory effort nonlabored Abd: soft, appropriately ttp, mild distention, +BS, incisions C/D/I MS: all 4 extremities are symmetrical with no cyanosis, clubbing, or edema. Psych: A&Ox3 with an appropriate affect.    Lab Results:  Recent Labs    07/24/22 0431 07/25/22 0859  WBC 5.3 5.0  HGB 14.3 14.9  HCT 41.9 44.1  PLT 120* 121*   BMET Recent Labs    07/26/22 0226  NA 135  K 4.5  CL 103  CO2 22  GLUCOSE 126*  BUN 14  CREATININE 1.05*  CALCIUM 8.9   PT/INR No results for input(s): "LABPROT", "INR" in the last 72 hours. CMP     Component Value Date/Time   NA 135 07/26/2022 0226   K 4.5 07/26/2022 0226   CL 103 07/26/2022 0226   CO2 22 07/26/2022 0226   GLUCOSE 126 (H) 07/26/2022 0226   BUN 14 07/26/2022 0226   CREATININE 1.05 (H) 07/26/2022 0226   CALCIUM 8.9 07/26/2022 0226   PROT 6.1 (L) 07/26/2022 0226   ALBUMIN 3.4 (L) 07/26/2022 0226   AST 71 (H) 07/26/2022 0226   ALT 65 (H) 07/26/2022 0226   ALKPHOS 63 07/26/2022 0226   BILITOT 1.0 07/26/2022 0226   GFRNONAA 55 (L) 07/26/2022 0226   GFRAA 53 (L) 09/12/2016 0237   Lipase     Component  Value Date/Time   LIPASE 37 07/23/2022 0607       Studies/Results: MR THORACIC SPINE WO CONTRAST  Result Date: 07/24/2022 CLINICAL DATA:  Mid back pain EXAM: MRI THORACIC SPINE WITHOUT CONTRAST TECHNIQUE: Multiplanar, multisequence MR imaging of the thoracic spine was performed. No intravenous contrast was administered. COMPARISON:  None Available. FINDINGS: Alignment:  Physiologic. Vertebrae: T7 hemangioma.  No acute abnormality. Cord: Spinal cord is normal. At the T9-10 level, there is a dorsal collection measuring 6 mm AP and 38 mm cc. Paraspinal and other soft tissues: Negative. Disc levels: T3-4: Small disc bulge without stenosis. T7-8: Small central disc protrusion without stenosis. T8-9: Small central disc protrusion without stenosis. T9-10: Small right subarticular disc protrusion without stenosis. T10-11: Small right subarticular disc protrusion without stenosis. The other thoracic levels are normal. IMPRESSION: 1. 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 2. Mild thoracic degenerative disc disease without stenosis. Electronically Signed   By: Ulyses Jarred M.D.   On: 07/24/2022 23:01   NM Hepatobiliary Liver Func  Result Date: 07/24/2022 CLINICAL DATA:  77 year old female with upper abdominal pain. Unrevealing right upper quadrant ultrasound, CT Abdomen and Pelvis. EXAM: NUCLEAR MEDICINE HEPATOBILIARY IMAGING TECHNIQUE: Sequential images of the abdomen were obtained out to 60  minutes following intravenous administration of radiopharmaceutical. RADIOPHARMACEUTICALS:  5.5 mCi Tc-53m Choletec IV COMPARISON:  CT Abdomen and Pelvis and ultrasound yesterday. FINDINGS: Prompt radiotracer uptake by the liver and clearance of the blood pool. Immediate CBD activity, and early gallbladder activity which is visible within 10-15 minutes. Both gallbladder and small bowel activity increase over the course of the exam. No abnormal radiotracer  accumulation identified. IMPRESSION: Normal hepatobiliary scan, patent CBD and cystic duct. Electronically Signed   By: HGenevie AnnM.D.   On: 07/24/2022 11:50    Anti-infectives: Anti-infectives (From admission, onward)    Start     Dose/Rate Route Frequency Ordered Stop   07/25/22 1030  cefTRIAXone (ROCEPHIN) 2 g in sodium chloride 0.9 % 100 mL IVPB  Status:  Discontinued       Note to Pharmacy: Pharmacy may adjust dosing strength / duration / interval for maximal efficacy   2 g 200 mL/hr over 30 Minutes Intravenous On call to O.R. 07/25/22 0940 07/25/22 1505        Assessment/Plan POD1 s/p laparoscopic cholecystectomy   - pain control adequate and patient starting to pass some flatus - recommend mobilization this AM - reg diet - reviewed post-op care and follow up  - stable for discharge from a surgical standpoint when medically cleared, follow up and instructions in AVS and Rx sent for some additional tramadol   FEN: reg diet VTE: SQH ID: [email or web address] 3/11  LOS: 0 days     KNorm Parcel [facility] 07/26/2022, 9:11 AM Please see Amion for pager number during day hours 7:00am-4:30pm

## 2022-07-26 NOTE — Anesthesia Postprocedure Evaluation (Signed)
Anesthesia Post Note  Patient: Heather Kirby  Procedure(s) Performed: LAPAROSCOPIC CHOLECYSTECTOMY (Abdomen)     Patient location during evaluation: PACU Anesthesia Type: General Level of consciousness: awake and alert Pain management: pain level controlled Vital Signs Assessment: post-procedure vital signs reviewed and stable Respiratory status: spontaneous breathing, nonlabored ventilation, respiratory function stable and patient connected to nasal cannula oxygen Cardiovascular status: blood pressure returned to baseline and stable Postop Assessment: no apparent nausea or vomiting Anesthetic complications: no   No notable events documented.  Last Vitals:  Vitals:   07/26/22 0409 07/26/22 0730  BP: 113/74 (!) 169/82  Pulse: 77 67  Resp: 16 17  Temp: 36.9 C 36.8 C  SpO2: 97% 97%    Last Pain:  Vitals:   07/26/22 0730  TempSrc: Oral  PainSc:                  [place]

## 2022-07-26 NOTE — TOC Transition Note (Signed)
Transition of Care [facility]) - CM/SW Discharge Note   Patient Details  Name: GRACELYN BELLOWS MRN: [id number] Date of Birth: 1945-11-24  Transition of Care [facility]) CM/SW Contact:  Curlene Labrum, RN Phone Number: 07/26/2022, 10:26 AM   Clinical Narrative:    CM met with the patient at the bedside to discuss discuss transitions of care needs - post-Laparoscopic Gallbladder removal - wounds clean and dry with no drains.  The patient has CPAP machine at home with no other mobility equipment needed at this time.  The patient lives at home with the husband and is independent.  The patient plans to discharge home today with the husband.     Final next level of care: Home/Self Care Barriers to Discharge: No Barriers Identified   Patient Goals and CMS Choice CMS Medicare.gov Compare Post Acute Care list provided to:: Patient Choice offered to / list presented to : Patient  Discharge Placement                         Discharge Plan and Services Additional resources added to the After Visit Summary for                                       Social Determinants of Health (SDOH) Interventions SDOH Screenings   Tobacco Use: Low Risk  (07/25/2022)     Readmission Risk Interventions    07/26/2022   10:26 AM  Readmission Risk Prevention Plan  Post Dischage Appt Complete  Medication Screening Complete  Transportation Screening Complete

## 2022-08-01 DIAGNOSIS — G930 Cerebral cysts: Secondary | ICD-10-CM | POA: Diagnosis not present

## 2022-08-01 DIAGNOSIS — Z9049 Acquired absence of other specified parts of digestive tract: Secondary | ICD-10-CM | POA: Diagnosis not present

## 2022-08-01 DIAGNOSIS — D696 Thrombocytopenia, unspecified: Secondary | ICD-10-CM | POA: Diagnosis not present

## 2022-08-01 DIAGNOSIS — E0590 Thyrotoxicosis, unspecified without thyrotoxic crisis or storm: Secondary | ICD-10-CM | POA: Diagnosis not present

## 2022-08-01 DIAGNOSIS — G4733 Obstructive sleep apnea (adult) (pediatric): Secondary | ICD-10-CM | POA: Diagnosis not present

## 2022-08-01 DIAGNOSIS — R1013 Epigastric pain: Secondary | ICD-10-CM | POA: Diagnosis not present

## 2022-08-01 DIAGNOSIS — K581 Irritable bowel syndrome with constipation: Secondary | ICD-10-CM | POA: Diagnosis not present

## 2022-08-22 ENCOUNTER — Other Ambulatory Visit: Payer: Self-pay | Admitting: Cardiology

## 2022-08-22 DIAGNOSIS — R072 Precordial pain: Secondary | ICD-10-CM

## 2022-08-22 DIAGNOSIS — G4733 Obstructive sleep apnea (adult) (pediatric): Secondary | ICD-10-CM | POA: Diagnosis not present

## 2022-08-22 NOTE — Progress Notes (Signed)
Recurrent chest pain, with nausea. Symptoms not improved after recent gallbladder surgery Previously, nuclear stress test in 2018. Will repeat exercise nuclear stress test.   Elder Negus, MD Pager: [phone] Office: [phone]

## 2022-08-23 DIAGNOSIS — L814 Other melanin hyperpigmentation: Secondary | ICD-10-CM | POA: Diagnosis not present

## 2022-08-23 DIAGNOSIS — C44722 Squamous cell carcinoma of skin of right lower limb, including hip: Secondary | ICD-10-CM | POA: Diagnosis not present

## 2022-08-23 DIAGNOSIS — L821 Other seborrheic keratosis: Secondary | ICD-10-CM | POA: Diagnosis not present

## 2022-08-23 DIAGNOSIS — L570 Actinic keratosis: Secondary | ICD-10-CM | POA: Diagnosis not present

## 2022-08-23 DIAGNOSIS — C44629 Squamous cell carcinoma of skin of left upper limb, including shoulder: Secondary | ICD-10-CM | POA: Diagnosis not present

## 2022-08-23 DIAGNOSIS — D485 Neoplasm of uncertain behavior of skin: Secondary | ICD-10-CM | POA: Diagnosis not present

## 2022-08-23 DIAGNOSIS — D229 Melanocytic nevi, unspecified: Secondary | ICD-10-CM | POA: Diagnosis not present

## 2022-08-23 DIAGNOSIS — Z85828 Personal history of other malignant neoplasm of skin: Secondary | ICD-10-CM | POA: Diagnosis not present

## 2022-08-23 DIAGNOSIS — Z08 Encounter for follow-up examination after completed treatment for malignant neoplasm: Secondary | ICD-10-CM | POA: Diagnosis not present

## 2022-08-23 DIAGNOSIS — R229 Localized swelling, mass and lump, unspecified: Secondary | ICD-10-CM | POA: Diagnosis not present

## 2022-08-30 ENCOUNTER — Ambulatory Visit: Payer: PPO

## 2022-08-30 DIAGNOSIS — R072 Precordial pain: Secondary | ICD-10-CM | POA: Diagnosis not present

## 2022-09-12 ENCOUNTER — Encounter: Payer: Self-pay | Admitting: Cardiology

## 2022-09-12 ENCOUNTER — Ambulatory Visit: Payer: PPO | Admitting: Cardiology

## 2022-09-12 VITALS — BP 110/67 | HR 86 | Resp 16 | Ht 66.0 in | Wt 136.0 lb

## 2022-09-12 DIAGNOSIS — R072 Precordial pain: Secondary | ICD-10-CM | POA: Diagnosis not present

## 2022-09-12 NOTE — Progress Notes (Signed)
Patient referred by Garlan Fillers, MD for precordial pain  Subjective:   Heather Kirby, female    DOB: 01-05-1946, 77 y.o.   MRN: [id number]   Chief Complaint  Patient presents with   Chest Pain   Follow-up   Results    Stress test     HPI  77 y.o. Caucasian female with hypertension, stable thrombocytopenia, referred for evaluation of precordial pain.  Patient continues to have random episodes of bilateral chest pain, unrelated to exertion, lasting for a few min at time. Reviewed recent test results with the patient, details below.     Current Outpatient Medications:    acetaminophen (TYLENOL) 325 MG tablet, Take 2 tablets (650 mg total) by mouth every 6 (six) hours as needed for mild pain or fever., Disp: , Rfl:    Calcium Carbonate-Vit D-Min (CALCIUM 1200 PO), Take 1 tablet by mouth every evening., Disp: , Rfl:    diphenhydramine-acetaminophen (TYLENOL PM) 25-500 MG TABS tablet, Take 1 tablet by mouth at bedtime., Disp: , Rfl:    LINZESS 145 MCG CAPS capsule, Take 145 mcg by mouth every morning., Disp: , Rfl:    methimazole (TAPAZOLE) 5 MG tablet, Take 2.5 mg by mouth 2 (two) times daily., Disp: , Rfl:    omeprazole (PRILOSEC) 40 MG capsule, Take 40 mg by mouth daily., Disp: , Rfl:    ondansetron (ZOFRAN) 4 MG tablet, 1 tablet Orally twice daily for 10 days As needed nausea, Disp: , Rfl:    simvastatin (ZOCOR) 40 MG tablet, Take 40 mg by mouth every evening., Disp: , Rfl:    traMADol (ULTRAM) 50 MG tablet, Take 1 tablet (50 mg total) by mouth every 4 (four) hours as needed for moderate pain or severe pain., Disp: 15 tablet, Rfl: 0   Cardiovascular and other pertinent studies:  EKG 07/23/2022: Sinus rhythm 67 bpm Normal EKG  Exercise nuclear stress test 08/30/2022: Myocardial perfusion is normal. Overall LV systolic function is normal without regional wall motion abnormalities. Stress LV EF: 54%. Low risk study. Nondiagnostic ECG stress due to target HR not  achieved (79% MPHR). The heart rate response was normal. The blood pressure response was normal. No previous exam available for comparison.    Recent labs: 07/26/2022: Glucose 126, BUN/Cr 14/1.05. EGFR 55. Na/K 135/4.5. Albumin 3.4. AST/ALT 71/65. Protein 6.1. Rest of the CMP normal H/H 14/44. MCV 88. Platelets 121  01/12/2021: Glucose 90, BUN/Cr 15/1.3. EGFR 39. Na/K 141/4.6. Rest of the CMP normal H/H 15/44. MCV 92. Platelets 122 HbA1C N/A Chol 143, TG 113, HDL 51, LDL 69 TSH 11 high    Review of Systems  Cardiovascular:  Positive for chest pain. Negative for dyspnea on exertion, leg swelling, palpitations and syncope.         Vitals:   09/12/22 1134  BP: 110/67  Pulse: 86  Resp: 16  SpO2: 97%     Body mass index is 21.95 kg/m. Filed Weights   09/12/22 1134  Weight: 136 lb (61.7 kg)     Objective:   Physical Exam Vitals and nursing note reviewed.  Constitutional:      General: She is not in acute distress. Neck:     Vascular: No JVD.  Cardiovascular:     Rate and Rhythm: Normal rate and regular rhythm.     Heart sounds: Normal heart sounds. No murmur heard. Pulmonary:     Effort: Pulmonary effort is normal.     Breath sounds: Normal breath sounds. No wheezing  or rales.  Musculoskeletal:     Right lower leg: No edema.     Left lower leg: No edema.         Assessment & Recommendations:   77 y.o. Caucasian female with hypertension, stable thrombocytopenia, referred for evaluation of precordial pain.  Chest pain: No ischemia on stress testing (08/2022). Unlikely cardiac in origin. Consider musculoskeletal or GI etiology.   Follow-up as needed   Elder Negus, MD Pager: [phone] Office: [phone]

## 2022-09-20 ENCOUNTER — Ambulatory Visit: Payer: PPO | Admitting: Adult Health

## 2022-09-21 DIAGNOSIS — G4733 Obstructive sleep apnea (adult) (pediatric): Secondary | ICD-10-CM | POA: Diagnosis not present

## 2022-09-23 DIAGNOSIS — D485 Neoplasm of uncertain behavior of skin: Secondary | ICD-10-CM | POA: Diagnosis not present

## 2022-09-23 DIAGNOSIS — D0471 Carcinoma in situ of skin of right lower limb, including hip: Secondary | ICD-10-CM | POA: Diagnosis not present

## 2022-09-23 DIAGNOSIS — C44722 Squamous cell carcinoma of skin of right lower limb, including hip: Secondary | ICD-10-CM | POA: Diagnosis not present

## 2022-09-23 DIAGNOSIS — C44622 Squamous cell carcinoma of skin of right upper limb, including shoulder: Secondary | ICD-10-CM | POA: Diagnosis not present

## 2022-09-23 DIAGNOSIS — I872 Venous insufficiency (chronic) (peripheral): Secondary | ICD-10-CM | POA: Diagnosis not present

## 2022-09-26 ENCOUNTER — Encounter: Payer: Self-pay | Admitting: Neurology

## 2022-09-26 ENCOUNTER — Ambulatory Visit: Payer: PPO | Admitting: Neurology

## 2022-09-26 VITALS — BP 128/85 | HR 67 | Ht 66.0 in | Wt 139.0 lb

## 2022-09-26 DIAGNOSIS — G4733 Obstructive sleep apnea (adult) (pediatric): Secondary | ICD-10-CM

## 2022-09-26 NOTE — Progress Notes (Signed)
Provider:  Melvyn Novas, MD  Primary Care Physician:  Garlan Fillers, MD [address] [place] [address]     Referring Provider: Garlan Fillers, Md [address] [place],  [place] [address]          Chief Complaint according to patient   Patient presents with:     New Patient (Initial Visit)           HISTORY OF PRESENT ILLNESS:  Heather Kirby is a 77 y.o. female patient who is here for revisit 09/26/2022 for  CPAP follow up.  The patient used a [employer] that was recalled. ( Months without therapy until she got the new ResMed.   There was no new baseline obtained, the HST was in 2019!   Thyroid function is well controlled, GERD treated with Omeprazole. Has undergone cholecystectomy. 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  Chief concern according to patient :  "My short term memory declined while I had no CPAP therapy".    The patient is doing very well and she likes her new ResMed machine which was provided by [employer].  She has been 85% compliant user 25 out of 30 days the average user time is 7-1/2 hours.  Her AutoSet has a range between 4 and 12 cm water with an AHI of 2.0.  She has some central apneas emerging on the therapy but the number is low and this does not worry me.  Her 95th percentile pressure is 6.8 cm and her air leak at the 95th percentile is only 1.7 L/min.  She is using a nasal cradle Respironics DreamWear mask.       Review of Systems: Out of a complete 14 system review, the patient complains of only the following symptoms, and all other reviewed systems are negative.:     How likely are you to doze in the following situations: 0 = not likely, 1 = slight chance, 2 = moderate chance, 3 = high chance   Sitting and Reading? Watching Television? Sitting inactive in a public place (theater or meeting)? As a passenger in a car for an hour without a break? Lying down in the afternoon when  circumstances permit? Sitting and talking to someone? Sitting quietly after lunch without alcohol? In a car, while stopped for a few minutes in traffic?  Overall the patient is highly compliant she does notice success of her therapy she endorsed the Epworth sleepiness score at 4 and the fatigue severity score at 25/ 63 points -  the geriatric depression score was endorsed at 2 out of 15 points.  Social History   Socioeconomic History   Marital status: Married    Spouse name: Not on file   Number of children: 1   Years of education: Not on file   Highest education level: Not on file  Occupational History   Occupation: retired  Tobacco Use   Smoking status: Never   Smokeless tobacco: Never  Vaping Use   Vaping Use: Never used  Substance and Sexual Activity   Alcohol use: Yes    Comment: occasional wine   Drug use: No   Sexual activity: Not on file  Other Topics Concern   Not on file  Social History Narrative   Lives at home with husband    Right handed   Caffeine: only what is in some chocolate, no coffee or caffeinated sodas    Social Determinants  of Health   Financial Resource Strain: Not on file  Food Insecurity: Not on file  Transportation Needs: Not on file  Physical Activity: Not on file  Stress: Not on file  Social Connections: Not on file    Family History  Problem Relation Age of Onset   Heart failure Mother    Hypertension Mother    Diabetes Father    Heart disease Father    Breast cancer Sister    Heart disease Sister    Hiatal hernia Maternal Grandmother    Heart disease Maternal Aunt    Stomach cancer Neg Hx    Rectal cancer Neg Hx    Esophageal cancer Neg Hx    Colon cancer Neg Hx     Past Medical History:  Diagnosis Date   Arthritis    GERD (gastroesophageal reflux disease)    HLD (hyperlipidemia)    Hyperthyroidism    Sleep apnea    Thrombocytopenia (HCC)     Past Surgical History:  Procedure Laterality Date   CATARACT EXTRACTION  W/ INTRAOCULAR LENS  IMPLANT, BILATERAL     right 8/18 and left was 9/18   CERVICAL ABLATION     irregular pap smear  11/2021   CHOLECYSTECTOMY N/A 07/25/2022   Procedure: LAPAROSCOPIC CHOLECYSTECTOMY;  Surgeon: Violeta Gelinas, MD;  [facility];  Service: General;  Laterality: N/A;   COLONOSCOPY     SHOULDER ARTHROSCOPY Right      Current Outpatient Medications on File Prior to Visit  Medication Sig Dispense Refill   acetaminophen (TYLENOL) 325 MG tablet Take 2 tablets (650 mg total) by mouth every 6 (six) hours as needed for mild pain or fever.     Calcium Carbonate-Vit D-Min (CALCIUM 1200 PO) Take 1 tablet by mouth every evening.     diphenhydramine-acetaminophen (TYLENOL PM) 25-500 MG TABS tablet Take 1 tablet by mouth at bedtime.     LINZESS 145 MCG CAPS capsule Take 145 mcg by mouth every morning.     methimazole (TAPAZOLE) 5 MG tablet Take 2.5 mg by mouth 2 (two) times daily.     Omeprazole-Sodium Bicarbonate (ZEGERID) 20-1100 MG CAPS capsule 1 capsule on an empty stomach Orally Once a day for 30 days substitution for current PPI     ondansetron (ZOFRAN) 4 MG tablet 1 tablet Orally twice daily for 10 days As needed nausea     simvastatin (ZOCOR) 40 MG tablet Take 40 mg by mouth every evening.     traMADol (ULTRAM) 50 MG tablet Take 1 tablet (50 mg total) by mouth every 4 (four) hours as needed for moderate pain or severe pain. 15 tablet 0   No current facility-administered medications on file prior to visit.    Allergies  Allergen Reactions   Alendronate Sodium     Other reaction(s): GI upset   Gabapentin     Other reaction(s): Severe Dry Mouth     DIAGNOSTIC DATA (LABS, IMAGING, TESTING) - I reviewed patient records, labs, notes, testing and imaging myself where available.  Lab Results  Component Value Date   WBC 5.0 07/25/2022   HGB 14.9 07/25/2022   HCT 44.1 07/25/2022   MCV 88.9 07/25/2022   PLT 121 (L) 07/25/2022      Component Value Date/Time   NA 135  07/26/2022 0226   K 4.5 07/26/2022 0226   CL 103 07/26/2022 0226   CO2 22 07/26/2022 0226   GLUCOSE 126 (H) 07/26/2022 0226   BUN 14 07/26/2022 0226   CREATININE 1.05 (  H) 07/26/2022 0226   CALCIUM 8.9 07/26/2022 0226   PROT 6.1 (L) 07/26/2022 0226   ALBUMIN 3.4 (L) 07/26/2022 0226   AST 71 (H) 07/26/2022 0226   ALT 65 (H) 07/26/2022 0226   ALKPHOS 63 07/26/2022 0226   BILITOT 1.0 07/26/2022 0226   GFRNONAA 55 (L) 07/26/2022 0226   GFRAA 53 (L) 09/12/2016 0237   No results found for: "CHOL", "HDL", "LDLCALC", "LDLDIRECT", "TRIG", "CHOLHDL" No results found for: "HGBA1C" No results found for: "VITAMINB12" No results found for: "TSH"  PHYSICAL EXAM:  Today's Vitals   09/26/22 0814  BP: 128/85  Pulse: 67  Weight: 139 lb (63 kg)  Height: 5\' 6"  (1.676 m)   Body mass index is 22.44 kg/m.   Wt Readings from Last 3 Encounters:  09/26/22 139 lb (63 kg)  09/12/22 136 lb (61.7 kg)  07/23/22 138 lb 7.2 oz (62.8 kg)     Ht Readings from Last 3 Encounters:  09/26/22 5\' 6"  (1.676 m)  09/12/22 5\' 6"  (1.676 m)  07/25/22 5\' 6"  (1.676 m)      General: The patient is awake, alert and appears not in acute distress. The patient is well groomed. Head: Normocephalic, atraumatic. Neck is supple. Mallampati 1,  neck circumference:13.5  inches . Nasal airflow not fully patent on the right side.   Retrognathia is not seen.  Dental status: biological  Cardiovascular:  Regular rate and cardiac rhythm by pulse,  without distended neck veins. Respiratory: Lungs are clear to auscultation.  Skin:  Without evidence of ankle edema, or rash. Trunk: The patient's posture is erect.   NEUROLOGIC EXAM: The patient is awake and alert, oriented to place and time.   Memory subjective described as intact.  Attention span & concentration ability appears normal.  Speech is fluent,  without  dysarthria, dysphonia or aphasia.  Mood and affect are appropriate.   Cranial nerves: no loss of smell or taste  reported  Pupils are equal and briskly reactive to light. Funduscopic exam deferred. .  Extraocular movements in vertical and horizontal planes were intact and without nystagmus. No Diplopia. Visual fields by finger perimetry are intact. Hearing impaired   Facial sensation intact to fine touch.  Facial motor strength is symmetric and tongue and uvula move midline.  Neck ROM : rotation, tilt and flexion extension were normal for age and shoulder shrug was symmetrical.    Motor exam:  Symmetric bulk, tone and ROM.   Normal tone without cog wheeling, symmetric grip strength .    ASSESSMENT AND PLAN 77 y.o. year old female  here with:    1) OSA on CPAP - this was the inauguration visit for her new RESMED device. Highly compliant patient.  RV in 12 months, please consider a repeat HST at one time to get a baseline.     I plan to follow up either personally or through our NP within 12 months.   I would like to thank Garlan Fillers, MD and Garlan Fillers, Md [address] [place],  [place] [address] for allowing me to meet with and to take care of this pleasant patient.    After spending a total time of  20  minutes face to face and additional time for physical and neurologic examination, review of laboratory studies,  personal review of imaging studies, reports and results of other testing and review of referral information / records as far as provided in visit,   Electronically signed by: Melvyn Novas, MD 09/26/2022 9:13  AM  [facility] and [employer] certified by [employer] of Sleep Medicine and Diplomate of the [employer] of Sleep Medicine. Board certified In Neurology through the ABPN, Fellow of the [employer] of Neurology. Medical Director of [employer].

## 2022-09-30 ENCOUNTER — Ambulatory Visit: Payer: PPO | Admitting: Cardiology

## 2022-10-07 DIAGNOSIS — C44612 Basal cell carcinoma of skin of right upper limb, including shoulder: Secondary | ICD-10-CM | POA: Diagnosis not present

## 2022-10-07 DIAGNOSIS — Z4802 Encounter for removal of sutures: Secondary | ICD-10-CM | POA: Diagnosis not present

## 2022-10-22 DIAGNOSIS — G4733 Obstructive sleep apnea (adult) (pediatric): Secondary | ICD-10-CM | POA: Diagnosis not present

## 2022-10-26 DIAGNOSIS — D0471 Carcinoma in situ of skin of right lower limb, including hip: Secondary | ICD-10-CM | POA: Diagnosis not present

## 2022-11-09 ENCOUNTER — Ambulatory Visit: Payer: PPO | Admitting: Internal Medicine

## 2022-11-09 DIAGNOSIS — D0461 Carcinoma in situ of skin of right upper limb, including shoulder: Secondary | ICD-10-CM | POA: Diagnosis not present

## 2022-11-21 DIAGNOSIS — G4733 Obstructive sleep apnea (adult) (pediatric): Secondary | ICD-10-CM | POA: Diagnosis not present

## 2022-12-01 DIAGNOSIS — G4733 Obstructive sleep apnea (adult) (pediatric): Secondary | ICD-10-CM | POA: Diagnosis not present

## 2022-12-22 DIAGNOSIS — G4733 Obstructive sleep apnea (adult) (pediatric): Secondary | ICD-10-CM | POA: Diagnosis not present

## 2023-01-19 DIAGNOSIS — N879 Dysplasia of cervix uteri, unspecified: Secondary | ICD-10-CM | POA: Diagnosis not present

## 2023-01-19 DIAGNOSIS — Z124 Encounter for screening for malignant neoplasm of cervix: Secondary | ICD-10-CM | POA: Diagnosis not present

## 2023-01-22 DIAGNOSIS — G4733 Obstructive sleep apnea (adult) (pediatric): Secondary | ICD-10-CM | POA: Diagnosis not present

## 2023-01-24 ENCOUNTER — Ambulatory Visit: Payer: PPO | Admitting: Adult Health

## 2023-02-08 DIAGNOSIS — L728 Other follicular cysts of the skin and subcutaneous tissue: Secondary | ICD-10-CM | POA: Diagnosis not present

## 2023-02-21 DIAGNOSIS — G4733 Obstructive sleep apnea (adult) (pediatric): Secondary | ICD-10-CM | POA: Diagnosis not present

## 2023-03-23 DIAGNOSIS — G4733 Obstructive sleep apnea (adult) (pediatric): Secondary | ICD-10-CM | POA: Diagnosis not present

## 2023-03-23 DIAGNOSIS — K5900 Constipation, unspecified: Secondary | ICD-10-CM | POA: Diagnosis not present

## 2023-03-23 DIAGNOSIS — E559 Vitamin D deficiency, unspecified: Secondary | ICD-10-CM | POA: Diagnosis not present

## 2023-03-23 DIAGNOSIS — G8929 Other chronic pain: Secondary | ICD-10-CM | POA: Diagnosis not present

## 2023-03-23 DIAGNOSIS — G43909 Migraine, unspecified, not intractable, without status migrainosus: Secondary | ICD-10-CM | POA: Diagnosis not present

## 2023-03-23 DIAGNOSIS — M1990 Unspecified osteoarthritis, unspecified site: Secondary | ICD-10-CM | POA: Diagnosis not present

## 2023-03-23 DIAGNOSIS — G4700 Insomnia, unspecified: Secondary | ICD-10-CM | POA: Diagnosis not present

## 2023-03-23 DIAGNOSIS — E785 Hyperlipidemia, unspecified: Secondary | ICD-10-CM | POA: Diagnosis not present

## 2023-03-23 DIAGNOSIS — K219 Gastro-esophageal reflux disease without esophagitis: Secondary | ICD-10-CM | POA: Diagnosis not present

## 2023-03-23 DIAGNOSIS — D696 Thrombocytopenia, unspecified: Secondary | ICD-10-CM | POA: Diagnosis not present

## 2023-03-23 DIAGNOSIS — H9193 Unspecified hearing loss, bilateral: Secondary | ICD-10-CM | POA: Diagnosis not present

## 2023-03-23 DIAGNOSIS — E0590 Thyrotoxicosis, unspecified without thyrotoxic crisis or storm: Secondary | ICD-10-CM | POA: Diagnosis not present

## 2023-03-24 DIAGNOSIS — G4733 Obstructive sleep apnea (adult) (pediatric): Secondary | ICD-10-CM | POA: Diagnosis not present

## 2023-03-27 DIAGNOSIS — L820 Inflamed seborrheic keratosis: Secondary | ICD-10-CM | POA: Diagnosis not present

## 2023-03-27 DIAGNOSIS — R229 Localized swelling, mass and lump, unspecified: Secondary | ICD-10-CM | POA: Diagnosis not present

## 2023-03-27 DIAGNOSIS — Z08 Encounter for follow-up examination after completed treatment for malignant neoplasm: Secondary | ICD-10-CM | POA: Diagnosis not present

## 2023-03-27 DIAGNOSIS — C44629 Squamous cell carcinoma of skin of left upper limb, including shoulder: Secondary | ICD-10-CM | POA: Diagnosis not present

## 2023-03-27 DIAGNOSIS — D1801 Hemangioma of skin and subcutaneous tissue: Secondary | ICD-10-CM | POA: Diagnosis not present

## 2023-03-27 DIAGNOSIS — Z85828 Personal history of other malignant neoplasm of skin: Secondary | ICD-10-CM | POA: Diagnosis not present

## 2023-04-23 DIAGNOSIS — G4733 Obstructive sleep apnea (adult) (pediatric): Secondary | ICD-10-CM | POA: Diagnosis not present

## 2023-05-01 DIAGNOSIS — K5900 Constipation, unspecified: Secondary | ICD-10-CM | POA: Diagnosis not present

## 2023-05-01 DIAGNOSIS — E785 Hyperlipidemia, unspecified: Secondary | ICD-10-CM | POA: Diagnosis not present

## 2023-05-01 DIAGNOSIS — M8580 Other specified disorders of bone density and structure, unspecified site: Secondary | ICD-10-CM | POA: Diagnosis not present

## 2023-05-01 DIAGNOSIS — R29898 Other symptoms and signs involving the musculoskeletal system: Secondary | ICD-10-CM | POA: Diagnosis not present

## 2023-05-01 DIAGNOSIS — E0590 Thyrotoxicosis, unspecified without thyrotoxic crisis or storm: Secondary | ICD-10-CM | POA: Diagnosis not present

## 2023-05-01 DIAGNOSIS — Z1212 Encounter for screening for malignant neoplasm of rectum: Secondary | ICD-10-CM | POA: Diagnosis not present

## 2023-05-04 DIAGNOSIS — M25531 Pain in right wrist: Secondary | ICD-10-CM | POA: Diagnosis not present

## 2023-05-04 DIAGNOSIS — E0590 Thyrotoxicosis, unspecified without thyrotoxic crisis or storm: Secondary | ICD-10-CM | POA: Diagnosis not present

## 2023-05-04 DIAGNOSIS — E785 Hyperlipidemia, unspecified: Secondary | ICD-10-CM | POA: Diagnosis not present

## 2023-05-04 DIAGNOSIS — K581 Irritable bowel syndrome with constipation: Secondary | ICD-10-CM | POA: Diagnosis not present

## 2023-05-04 DIAGNOSIS — Z0000 Encounter for general adult medical examination without abnormal findings: Secondary | ICD-10-CM | POA: Diagnosis not present

## 2023-05-04 DIAGNOSIS — D696 Thrombocytopenia, unspecified: Secondary | ICD-10-CM | POA: Diagnosis not present

## 2023-05-04 DIAGNOSIS — G4733 Obstructive sleep apnea (adult) (pediatric): Secondary | ICD-10-CM | POA: Diagnosis not present

## 2023-05-04 DIAGNOSIS — M5117 Intervertebral disc disorders with radiculopathy, lumbosacral region: Secondary | ICD-10-CM | POA: Diagnosis not present

## 2023-05-04 DIAGNOSIS — Z1331 Encounter for screening for depression: Secondary | ICD-10-CM | POA: Diagnosis not present

## 2023-05-04 DIAGNOSIS — Z1339 Encounter for screening examination for other mental health and behavioral disorders: Secondary | ICD-10-CM | POA: Diagnosis not present

## 2023-05-24 DIAGNOSIS — G4733 Obstructive sleep apnea (adult) (pediatric): Secondary | ICD-10-CM | POA: Diagnosis not present

## 2023-05-26 DIAGNOSIS — G4733 Obstructive sleep apnea (adult) (pediatric): Secondary | ICD-10-CM | POA: Diagnosis not present

## 2023-06-24 DIAGNOSIS — G4733 Obstructive sleep apnea (adult) (pediatric): Secondary | ICD-10-CM | POA: Diagnosis not present

## 2023-07-22 DIAGNOSIS — G4733 Obstructive sleep apnea (adult) (pediatric): Secondary | ICD-10-CM | POA: Diagnosis not present

## 2023-08-09 ENCOUNTER — Other Ambulatory Visit (HOSPITAL_COMMUNITY): Payer: Self-pay | Admitting: Obstetrics and Gynecology

## 2023-08-09 DIAGNOSIS — M8588 Other specified disorders of bone density and structure, other site: Secondary | ICD-10-CM | POA: Diagnosis not present

## 2023-08-09 DIAGNOSIS — Z124 Encounter for screening for malignant neoplasm of cervix: Secondary | ICD-10-CM | POA: Diagnosis not present

## 2023-08-09 DIAGNOSIS — N958 Other specified menopausal and perimenopausal disorders: Secondary | ICD-10-CM | POA: Diagnosis not present

## 2023-08-09 DIAGNOSIS — Z1231 Encounter for screening mammogram for malignant neoplasm of breast: Secondary | ICD-10-CM | POA: Diagnosis not present

## 2023-08-09 DIAGNOSIS — Z6823 Body mass index (BMI) 23.0-23.9, adult: Secondary | ICD-10-CM | POA: Diagnosis not present

## 2023-08-09 DIAGNOSIS — Z8249 Family history of ischemic heart disease and other diseases of the circulatory system: Secondary | ICD-10-CM

## 2023-08-16 ENCOUNTER — Ambulatory Visit (HOSPITAL_COMMUNITY)
Admission: RE | Admit: 2023-08-16 | Discharge: 2023-08-16 | Disposition: A | Discharge status: Home / Self Care | Payer: Self-pay | Source: Ambulatory Visit | Attending: Obstetrics and Gynecology | Admitting: Obstetrics and Gynecology

## 2023-08-16 DIAGNOSIS — Z8249 Family history of ischemic heart disease and other diseases of the circulatory system: Secondary | ICD-10-CM | POA: Insufficient documentation

## 2023-08-18 ENCOUNTER — Encounter: Payer: Self-pay | Admitting: Cardiology

## 2023-08-18 DIAGNOSIS — Z79899 Other long term (current) drug therapy: Secondary | ICD-10-CM

## 2023-08-18 DIAGNOSIS — E782 Mixed hyperlipidemia: Secondary | ICD-10-CM

## 2023-08-24 ENCOUNTER — Telehealth: Payer: Self-pay | Admitting: Cardiology

## 2023-08-24 ENCOUNTER — Ambulatory Visit: Attending: Cardiology | Admitting: Cardiology

## 2023-08-24 ENCOUNTER — Encounter: Payer: Self-pay | Admitting: Cardiology

## 2023-08-24 VITALS — BP 108/60 | HR 72 | Resp 16 | Ht 66.0 in | Wt 136.8 lb

## 2023-08-24 DIAGNOSIS — R072 Precordial pain: Secondary | ICD-10-CM

## 2023-08-24 DIAGNOSIS — Z01812 Encounter for preprocedural laboratory examination: Secondary | ICD-10-CM | POA: Insufficient documentation

## 2023-08-24 DIAGNOSIS — Z79899 Other long term (current) drug therapy: Secondary | ICD-10-CM

## 2023-08-24 DIAGNOSIS — R931 Abnormal findings on diagnostic imaging of heart and coronary circulation: Secondary | ICD-10-CM | POA: Diagnosis not present

## 2023-08-24 MED ORDER — ROSUVASTATIN CALCIUM 20 MG PO TABS: 20.0000 mg | ORAL_TABLET | Freq: Every day | ORAL | 3 refills | Status: AC

## 2023-08-24 MED ORDER — ASPIRIN 81 MG PO TBEC: 81.0000 mg | DELAYED_RELEASE_TABLET | Freq: Every day | ORAL | 3 refills | Status: DC

## 2023-08-24 MED ORDER — NITROGLYCERIN 0.4 MG SL SUBL: 0.4000 mg | SUBLINGUAL_TABLET | SUBLINGUAL | 3 refills | Status: AC | PRN

## 2023-08-24 MED ORDER — METOPROLOL TARTRATE 100 MG PO TABS: 100.0000 mg | ORAL_TABLET | Freq: Once | ORAL | 0 refills | Status: DC

## 2023-08-24 NOTE — Telephone Encounter (Signed)
   Pt c/o of Chest Pain: STAT if active CP, including tightness, pressure, jaw pain, radiating pain to shoulder/upper arm/back, CP unrelieved by Nitro. Symptoms reported of SOB, nausea, vomiting, sweating.  1. Are you having CP right now? No     2. Are you experiencing any other symptoms (ex. SOB, nausea, vomiting, sweating)? No    3. Is your CP continuous or coming and going? Coming and going    4. Have you taken Nitroglycerin? No    5. How long have you been experiencing CP? Few weeks     6. If NO CP at time of call then end call with telling Pt to call back or call 911 if Chest pain returns prior to return call from triage team.

## 2023-08-24 NOTE — Telephone Encounter (Signed)
 I suspect this will be a quick visit for evaluation and reassurance.  Okay to add to my schedule today as a double book.  Thanks MJP

## 2023-08-24 NOTE — Telephone Encounter (Addendum)
 Pt did not intend for triage to call her, said she just called in to make an appt.  I explained to her that we had to call being that she reported chest discomfort to the scheduler. Pt denies any current CP/discomfort.  Says it is "very mild" when occurs.  She states she is "under a lot of stress and this might be anxiety related", but she scheduled an appt w/ Dr. Rosemary Holms next month to discuss this and her "calcium build up" found on recent calcium scoring. We discussed when to go to the ER. Aware forwarding to MD for his FYI and that we will call back if he has any other advisement.  She is agreeable to plan.

## 2023-08-24 NOTE — Telephone Encounter (Signed)
 Appointment scheduled today at 1600.

## 2023-08-24 NOTE — Progress Notes (Signed)
 Cardiology Office Note:  .   Date:  08/24/2023  ID:  Heather Kirby, DOB 1945-09-06, MRN [id number] PCP: Garlan Fillers, MD  [facility] Providers Cardiologist:  Truett Mainland, MD PCP: Garlan Fillers, MD  Chief Complaint  Patient presents with   Chest Pain     Heather Kirby is a 78 y.o. female with hypertension, stable thrombocytopenia, elevated CAC, chest pain  Patient was recently found to have elevated calcium score on CT chest.  She has had episodes of chest pain, that was once relieved by bowel movement.  In addition, she is also had left-sided chest pains that last for a few minutes at a time.  She has not noticed any specific correlation with physical exertion.  She is anxious given the recent finding of elevated coronary artery calcium score.    Vitals:   08/24/23 1557  Pulse: 72  Resp: 16  SpO2: 98%      Review of Systems  Cardiovascular:  Positive for chest pain. Negative for dyspnea on exertion, leg swelling, palpitations and syncope.        Studies Reviewed: Marland Kitchen        EKG 08/24/2023: Normal sinus rhythm Normal ECG When compared with ECG of 23-Jul-2022 06:13,  T wave inverted in V1, normal    Independently interpreted 07/2022: Hb 14.9 Cr 1.05 AST/ALT 71/65, protein 5.1  Independently interpreted 04/2023: Chol 130, TG 57, HDL 48, LDL 71  CT cardiac scoring 08/2023: Total score 83 (all in LAD) 50th percentile  Exercise nuclear stress test 08/30/2022: Myocardial perfusion is normal. Overall LV systolic function is normal without regional wall motion abnormalities. Stress LV EF: 54%. Low risk study. Nondiagnostic ECG stress due to target HR not achieved (79% MPHR). The heart rate response was normal. The blood pressure response was normal. No previous exam available for comparison.       Physical Exam Vitals and nursing note reviewed.  Constitutional:      General: She is not in acute distress. Neck:     Vascular: No  JVD.  Cardiovascular:     Rate and Rhythm: Normal rate and regular rhythm.     Heart sounds: Normal heart sounds. No murmur heard. Pulmonary:     Effort: Pulmonary effort is normal.     Breath sounds: Normal breath sounds. No wheezing or rales.  Musculoskeletal:     Right lower leg: No edema.     Left lower leg: No edema.      VISIT DIAGNOSES:   ICD-10-CM   1. Elevated coronary artery calcium score  R93.1 EKG 12-Lead    2. Medication management  Z79.899     3. Pre-procedural laboratory examinations  Z01.812 Basic metabolic panel with GFR    Basic metabolic panel with GFR    4. Precordial pain  R07.2 CT CORONARY MORPH W/CTA COR W/SCORE W/CA W/CM &/OR WO/CM       Heather Kirby is a 78 y.o. female with hypertension, stable thrombocytopenia, elevated CAC, chest pain Assessment & Plan  Chest pain: Not typical for angina.  However, she has risk factors for CAD including age, and elevated coronary artery calcium score. Continue Crestor 10 mg daily. Added aspirin 81 mg daily and as needed similar to screening. Recommend coronary CT angiogram for further evaluation of obstructive CAD.       Meds ordered this encounter  Medications   aspirin EC 81 MG tablet    Sig: Take 1 tablet (81 mg total) by mouth  daily. Swallow whole.    Dispense:  90 tablet    Refill:  3   nitroGLYCERIN (NITROSTAT) 0.4 MG SL tablet    Sig: Place 1 tablet (0.4 mg total) under the tongue every 5 (five) minutes as needed for chest pain.    Dispense:  30 tablet    Refill:  3   metoprolol tartrate (LOPRESSOR) 100 MG tablet    Sig: Take 1 tablet (100 mg total) by mouth once for 1 dose. Take 90-120 minutes prior to scan. Hold for SBP less than 110.    Dispense:  1 tablet    Refill:  0     F/u in 3 months  Signed, Elder Negus, MD

## 2023-08-24 NOTE — Patient Instructions (Addendum)
 Medication Instructions:  START Aspirin 81 mg take one tablet by mouth daily  START AS NEEDED FOR CHEST PAIN: Nitroglycerin  *If you need a refill on your cardiac medications before your next appointment, please call your pharmacy*  Labs: BMET today  Testing/Procedures: CTA  Your physician has requested that you have cardiac CTA. Cardiac computed tomography (CT) is a painless test that uses an x-ray machine to take clear, detailed pictures of your heart. For further information please visit [email or web address] Please follow instruction sheet as given.    Follow-Up: At [facility], you and your health needs are our priority.  As part of our continuing mission to provide you with exceptional heart care, our providers are all part of one team.  This team includes your primary Cardiologist (physician) and Advanced Practice Providers or APPs (Physician Assistants and Nurse Practitioners) who all work together to provide you with the care you need, when you need it.  Your next appointment:   3 month(s)  Provider:   DR. Rosemary Holms OR APP  We recommend signing up for the patient portal called "MyChart".  Sign up information is provided on this After Visit Summary.  MyChart is used to connect with patients for Virtual Visits (Telemedicine).  Patients are able to view lab/test results, encounter notes, upcoming appointments, etc.  Non-urgent messages can be sent to your provider as well.   To learn more about what you can do with MyChart, go to [email or web address].   Other Instructions    Your cardiac CT will be scheduled at one of the below locations:   [facility] [address] [place], [place] [address] [phone]  If scheduled at [facility], please arrive at the [facility] and Children's Entrance (Entrance C2) of [facility] 30 minutes prior to test start time. You can use the FREE valet parking offered at entrance C (encouraged to control  the heart rate for the test)  Proceed to the [facility] Radiology Department (first floor) to check-in and test prep.  All radiology patients and guests should use entrance C2 at [facility], accessed from [facility], even though the hospital's physical address listed is [address].    Please follow these instructions carefully (unless otherwise directed):  An IV will be required for this test and Nitroglycerin will be given.  Hold all erectile dysfunction medications at least 3 days (72 hrs) prior to test. (Ie viagra, cialis, sildenafil, tadalafil, etc)   On the Night Before the Test: Be sure to Drink plenty of water. Do not consume any caffeinated/decaffeinated beverages or chocolate 12 hours prior to your test. Do not take any antihistamines 12 hours prior to your test.  On the Day of the Test: Drink plenty of water until 1 hour prior to the test. Do not eat any food 1 hour prior to test. You may take your regular medications prior to the test.  Take metoprolol (Lopressor) two hours prior to test. If you take Furosemide/Hydrochlorothiazide/Spironolactone/Chlorthalidone, please HOLD on the morning of the test. Patients who wear a continuous glucose monitor MUST remove the device prior to scanning. FEMALES- please wear underwire-free bra if available, avoid dresses & tight clothing      After the Test: Drink plenty of water. After receiving IV contrast, you may experience a mild flushed feeling. This is normal. On occasion, you may experience a mild rash up to 24 hours after the test. This is not dangerous. If this occurs, you can  take Benadryl 25 mg, Zyrtec, Claritin, or Allegra and increase your fluid intake. (Patients taking Tikosyn should avoid Benadryl, and may take Zyrtec, Claritin, or Allegra) If you experience trouble breathing, this can be serious. If it is severe call 911 IMMEDIATELY. If it is mild, please call our office.  We will call to  schedule your test 2-4 weeks out understanding that some insurance companies will need an authorization prior to the service being performed.   For more information and frequently asked questions, please visit our website : [email or web address]  For non-scheduling related questions, please contact the cardiac imaging nurse navigator should you have any questions/concerns: Cardiac Imaging Nurse Navigators Direct Office Dial: [phone]   For scheduling needs, including cancellations and rescheduling, please call [place], [phone].       1st Floor: - Lobby - Registration  - Pharmacy  - Lab - Cafe  2nd Floor: - PV Lab - Diagnostic Testing (echo, CT, nuclear med)  3rd Floor: - Vacant  4th Floor: - TCTS (cardiothoracic surgery) - AFib Clinic - Structural Heart Clinic - Vascular Surgery  - Vascular Ultrasound  5th Floor: - HeartCare Cardiology (general and EP) - Clinical Pharmacy for coumadin, hypertension, lipid, weight-loss medications, and med management appointments    Valet parking services will be available as well.

## 2023-08-25 LAB — BASIC METABOLIC PANEL WITH GFR
BUN/Creatinine Ratio: 10 — ABNORMAL LOW (ref 12–28)
BUN: 12 mg/dL (ref 8–27)
CO2: 23 mmol/L (ref 20–29)
Calcium: 9.6 mg/dL (ref 8.7–10.3)
Chloride: 97 mmol/L (ref 96–106)
Creatinine, Ser: 1.2 mg/dL — ABNORMAL HIGH (ref 0.57–1.00)
Glucose: 89 mg/dL (ref 70–99)
Potassium: 4.9 mmol/L (ref 3.5–5.2)
Sodium: 136 mmol/L (ref 134–144)
eGFR: 46 mL/min/{1.73_m2} — ABNORMAL LOW (ref >59)

## 2023-08-25 NOTE — Progress Notes (Signed)
 Recommend increasing water intake around the time of CT scan to allow for contrast to be flushed out easily.   Thanks MJP

## 2023-09-01 DIAGNOSIS — G4733 Obstructive sleep apnea (adult) (pediatric): Secondary | ICD-10-CM | POA: Diagnosis not present

## 2023-09-15 ENCOUNTER — Encounter (HOSPITAL_COMMUNITY): Payer: Self-pay

## 2023-09-19 ENCOUNTER — Ambulatory Visit (HOSPITAL_COMMUNITY)
Admission: RE | Admit: 2023-09-19 | Discharge: 2023-09-19 | Disposition: A | Discharge status: Home / Self Care | Source: Ambulatory Visit | Attending: Cardiology | Admitting: Cardiology

## 2023-09-19 ENCOUNTER — Ambulatory Visit (HOSPITAL_BASED_OUTPATIENT_CLINIC_OR_DEPARTMENT_OTHER)
Admission: RE | Admit: 2023-09-19 | Discharge: 2023-09-19 | Disposition: A | Discharge status: Home / Self Care | Payer: Self-pay | Source: Ambulatory Visit | Attending: Cardiology | Admitting: Cardiology

## 2023-09-19 ENCOUNTER — Other Ambulatory Visit: Payer: Self-pay | Admitting: Cardiology

## 2023-09-19 DIAGNOSIS — R931 Abnormal findings on diagnostic imaging of heart and coronary circulation: Secondary | ICD-10-CM

## 2023-09-19 DIAGNOSIS — I2510 Atherosclerotic heart disease of native coronary artery without angina pectoris: Secondary | ICD-10-CM

## 2023-09-19 DIAGNOSIS — R072 Precordial pain: Secondary | ICD-10-CM | POA: Insufficient documentation

## 2023-09-19 MED ORDER — IOHEXOL 350 MG/ML SOLN
100.0000 mL | Freq: Once | INTRAVENOUS | Status: AC | PRN
  Administered 2023-09-19: 100 mL via INTRAVENOUS

## 2023-09-19 MED ORDER — NITROGLYCERIN 0.4 MG SL SUBL
0.8000 mg | SUBLINGUAL_TABLET | Freq: Once | SUBLINGUAL | Status: AC
Start: 2023-09-19 — End: 2023-09-19
  Administered 2023-09-19: 0.8 mg via SUBLINGUAL

## 2023-09-20 ENCOUNTER — Other Ambulatory Visit: Payer: Self-pay

## 2023-09-20 NOTE — Progress Notes (Signed)
 Moderate nonobstructive coronary artery disease.  No heart catheterization needed.  Continue medical management. Continue aspirin  81 mg daily. Recommend increasing Crestor  from 10 mg daily to 20 mg daily to further improvement in lipid panel.  Can check lipid panel at regular visit with Dr. Adan Holms anytime after July. Belated happy birthday.  Thanks MJP

## 2023-09-21 ENCOUNTER — Encounter: Payer: Self-pay | Admitting: Podiatry

## 2023-09-21 ENCOUNTER — Ambulatory Visit: Admitting: Podiatry

## 2023-09-21 DIAGNOSIS — Z9889 Other specified postprocedural states: Secondary | ICD-10-CM | POA: Insufficient documentation

## 2023-09-21 DIAGNOSIS — B351 Tinea unguium: Secondary | ICD-10-CM | POA: Diagnosis not present

## 2023-09-21 DIAGNOSIS — L600 Ingrowing nail: Secondary | ICD-10-CM

## 2023-09-21 DIAGNOSIS — M79676 Pain in unspecified toe(s): Secondary | ICD-10-CM

## 2023-09-21 MED ORDER — NEOMYCIN-POLYMYXIN-HC 1 % OT SOLN: OTIC | 1 refills | Status: DC

## 2023-09-21 NOTE — Patient Instructions (Signed)

## 2023-09-22 NOTE — Progress Notes (Signed)
 Perfect.  Continue the same.  Thanks MJP

## 2023-09-23 ENCOUNTER — Encounter: Payer: Self-pay | Admitting: Cardiology

## 2023-09-25 ENCOUNTER — Encounter: Payer: Self-pay | Admitting: Podiatry

## 2023-09-25 DIAGNOSIS — L538 Other specified erythematous conditions: Secondary | ICD-10-CM | POA: Diagnosis not present

## 2023-09-25 DIAGNOSIS — L814 Other melanin hyperpigmentation: Secondary | ICD-10-CM | POA: Diagnosis not present

## 2023-09-25 DIAGNOSIS — L2989 Other pruritus: Secondary | ICD-10-CM | POA: Diagnosis not present

## 2023-09-25 DIAGNOSIS — D229 Melanocytic nevi, unspecified: Secondary | ICD-10-CM | POA: Diagnosis not present

## 2023-09-25 DIAGNOSIS — C44629 Squamous cell carcinoma of skin of left upper limb, including shoulder: Secondary | ICD-10-CM | POA: Diagnosis not present

## 2023-09-25 DIAGNOSIS — Z85828 Personal history of other malignant neoplasm of skin: Secondary | ICD-10-CM | POA: Diagnosis not present

## 2023-09-25 DIAGNOSIS — R208 Other disturbances of skin sensation: Secondary | ICD-10-CM | POA: Diagnosis not present

## 2023-09-25 DIAGNOSIS — R229 Localized swelling, mass and lump, unspecified: Secondary | ICD-10-CM | POA: Diagnosis not present

## 2023-09-25 DIAGNOSIS — L820 Inflamed seborrheic keratosis: Secondary | ICD-10-CM | POA: Diagnosis not present

## 2023-09-25 DIAGNOSIS — L821 Other seborrheic keratosis: Secondary | ICD-10-CM | POA: Diagnosis not present

## 2023-09-25 DIAGNOSIS — Z08 Encounter for follow-up examination after completed treatment for malignant neoplasm: Secondary | ICD-10-CM | POA: Diagnosis not present

## 2023-09-25 NOTE — Progress Notes (Signed)
 Subjective:  Patient ID: Heather Kirby, female    DOB: 1946/02/12,  MRN: [id number] HPI Chief Complaint  Patient presents with   Debridement    Requesting toenail trim - Dr. Adan Holms referred, also check hallux nail left - injury to nail years ago and falls off occasionally   New Patient (Initial Visit)    78 y.o. female presents with the above complaint.   ROS: Denies fever chills nausea vomiting muscle aches pains calf pain back pain chest pain shortness of breath.  Past Medical History:  Diagnosis Date   Arthritis    GERD (gastroesophageal reflux disease)    HLD (hyperlipidemia)    Hyperthyroidism    Sleep apnea    Thrombocytopenia (HCC)    Past Surgical History:  Procedure Laterality Date   CATARACT EXTRACTION W/ INTRAOCULAR LENS  IMPLANT, BILATERAL     right 8/18 and left was 9/18   CERVICAL ABLATION     irregular pap smear  11/2021   CHOLECYSTECTOMY N/A 07/25/2022   Procedure: LAPAROSCOPIC CHOLECYSTECTOMY;  Surgeon: Dorena Gander, MD;  Location: [facility] [address] OR;  Service: General;  Laterality: N/A;   COLONOSCOPY     SHOULDER ARTHROSCOPY Right     Current Outpatient Medications:    NEOMYCIN -POLYMYXIN-HYDROCORTISONE (CORTISPORIN) 1 % SOLN OTIC solution, Apply 1-2 drops to toe BID after soaking, Disp: 10 mL, Rfl: 1   acetaminophen  (TYLENOL ) 325 MG tablet, Take 2 tablets (650 mg total) by mouth every 6 (six) hours as needed for mild pain or fever., Disp: , Rfl:    aspirin  EC 81 MG tablet, Take 1 tablet (81 mg total) by mouth daily. Swallow whole., Disp: 90 tablet, Rfl: 3   Calcium  Carbonate-Vit D-Min (CALCIUM  1200 PO), Take 1 tablet by mouth in the morning and at bedtime., Disp: , Rfl:    diphenhydramine-acetaminophen  (TYLENOL  PM) 25-500 MG TABS tablet, Take 1 tablet by mouth at bedtime., Disp: , Rfl:    FOSAMAX 70 MG tablet, Take 70 mg by mouth once a week., Disp: , Rfl:    LINZESS 145 MCG CAPS capsule, Take 145 mcg by mouth every morning., Disp: , Rfl:    methimazole  (TAPAZOLE )  5 MG tablet, Take 2.5 mg by mouth 2 (two) times daily., Disp: , Rfl:    metoprolol  tartrate (LOPRESSOR ) 100 MG tablet, Take 1 tablet (100 mg total) by mouth once for 1 dose. Take 90-120 minutes prior to scan. Hold for SBP less than 110., Disp: 1 tablet, Rfl: 0   nitroGLYCERIN  (NITROSTAT ) 0.4 MG SL tablet, Place 1 tablet (0.4 mg total) under the tongue every 5 (five) minutes as needed for chest pain., Disp: 30 tablet, Rfl: 3   omeprazole (PRILOSEC) 40 MG capsule, Take 40 mg by mouth daily., Disp: , Rfl:    Omeprazole-Sodium Bicarbonate (ZEGERID) 20-1100 MG CAPS capsule, 1 capsule on an empty stomach Orally Once a day for 30 days substitution for current PPI, Disp: , Rfl:    ondansetron  (ZOFRAN ) 4 MG tablet, 1 tablet Orally twice daily for 10 days As needed nausea, Disp: , Rfl:    rosuvastatin  (CRESTOR ) 20 MG tablet, Take 1 tablet (20 mg total) by mouth daily., Disp: 90 tablet, Rfl: 3   traMADol  (ULTRAM ) 50 MG tablet, Take 1 tablet (50 mg total) by mouth every 4 (four) hours as needed for moderate pain or severe pain., Disp: 15 tablet, Rfl: 0  Allergies  Allergen Reactions   Alendronate Sodium     Other reaction(s): GI upset   Gabapentin     Other  reaction(s): Severe Dry Mouth   Review of Systems Objective:  There were no vitals filed for this visit.  General: Well developed, nourished, in no acute distress, alert and oriented x3   Dermatological: Skin is warm, dry and supple bilateral. Nails x 10 are well maintained; remaining integument appears unremarkable at this time. There are no open sores, no preulcerative lesions, no rash or signs of infection present.  Hallux nail left demonstrates a double layer nail 1 that appears to be growing to the nailbed the other appears to be growing from what remains of matrix.  It is separated and mildly tender.  There is mild erythema proximally no cellulitis drainage odor hallux left.  Otherwise the remainder of the nails are thickened and dystrophic but  well-maintained  Vascular: Dorsalis Pedis artery and Posterior Tibial artery pedal pulses are 2/4 bilateral with immedate capillary fill time. Pedal hair growth present. No varicosities and no lower extremity edema present bilateral.   Neruologic: Grossly intact via light touch bilateral. Vibratory intact via tuning fork bilateral. Protective threshold with Semmes Wienstein monofilament intact to all pedal sites bilateral. Patellar and Achilles deep tendon reflexes 2+ bilateral. No Babinski or clonus noted bilateral.   Musculoskeletal: No gross boney pedal deformities bilateral. No pain, crepitus, or limitation noted with foot and ankle range of motion bilateral. Muscular strength 5/5 in all groups tested bilateral.  Gait: Unassisted, Nonantalgic.    Radiographs:  None taken  Assessment & Plan:   Assessment: Painful mycotic nails painful hallux nail left  Plan: Total nail avulsion with matrixectomy hallux left.  Tolerated procedure well after local anesthetic was administered was given both oral and home-going structures.  Will follow-up with her in a few weeks.  I did also trim her other nails.     Erial Fikes T. [place], [place]

## 2023-09-27 ENCOUNTER — Ambulatory Visit: Admitting: Cardiology

## 2023-10-02 NOTE — Telephone Encounter (Signed)
 Called patient. She states her toe is healed and dry. She is having no concerns at this time.

## 2023-10-05 ENCOUNTER — Ambulatory Visit: Admitting: Podiatry

## 2023-10-05 DIAGNOSIS — L600 Ingrowing nail: Secondary | ICD-10-CM

## 2023-10-05 DIAGNOSIS — Z9889 Other specified postprocedural states: Secondary | ICD-10-CM

## 2023-10-09 NOTE — Progress Notes (Signed)
 She presents today for follow-up of her matrixectomy hallux left.  She states that is doing fine no problems.  She is concerned about it being red.  Objective: Vital signs are stable alert oriented x 3 there is some mild erythema no edema no cellulitis some serosanguineous drainage on the Band-Aid but no signs of infection.  Assessment: Well-healing surgical toe.  Plan: Follow-up with me on an as-needed basis, during the daytime leave open at night continue to soak until all redness is subsided

## 2023-11-14 ENCOUNTER — Emergency Department (HOSPITAL_COMMUNITY)

## 2023-11-14 ENCOUNTER — Encounter (HOSPITAL_COMMUNITY): Payer: Self-pay | Admitting: Emergency Medicine

## 2023-11-14 ENCOUNTER — Other Ambulatory Visit: Payer: Self-pay

## 2023-11-14 ENCOUNTER — Emergency Department (HOSPITAL_COMMUNITY)
Admission: EM | Admit: 2023-11-14 | Discharge: 2023-11-14 | Disposition: A | Discharge status: Home / Self Care | Attending: Emergency Medicine | Admitting: Emergency Medicine

## 2023-11-14 DIAGNOSIS — Z7982 Long term (current) use of aspirin: Secondary | ICD-10-CM | POA: Insufficient documentation

## 2023-11-14 DIAGNOSIS — R110 Nausea: Secondary | ICD-10-CM | POA: Diagnosis not present

## 2023-11-14 DIAGNOSIS — I491 Atrial premature depolarization: Secondary | ICD-10-CM | POA: Diagnosis not present

## 2023-11-14 DIAGNOSIS — R079 Chest pain, unspecified: Secondary | ICD-10-CM | POA: Diagnosis not present

## 2023-11-14 DIAGNOSIS — R1013 Epigastric pain: Secondary | ICD-10-CM | POA: Insufficient documentation

## 2023-11-14 DIAGNOSIS — Z79899 Other long term (current) drug therapy: Secondary | ICD-10-CM | POA: Insufficient documentation

## 2023-11-14 DIAGNOSIS — K5732 Diverticulitis of large intestine without perforation or abscess without bleeding: Secondary | ICD-10-CM | POA: Diagnosis not present

## 2023-11-14 DIAGNOSIS — R7401 Elevation of levels of liver transaminase levels: Secondary | ICD-10-CM | POA: Insufficient documentation

## 2023-11-14 DIAGNOSIS — R0789 Other chest pain: Secondary | ICD-10-CM | POA: Diagnosis not present

## 2023-11-14 DIAGNOSIS — I10 Essential (primary) hypertension: Secondary | ICD-10-CM | POA: Insufficient documentation

## 2023-11-14 DIAGNOSIS — R61 Generalized hyperhidrosis: Secondary | ICD-10-CM | POA: Diagnosis not present

## 2023-11-14 DIAGNOSIS — I7100 Dissection of unspecified site of aorta: Secondary | ICD-10-CM | POA: Diagnosis not present

## 2023-11-14 DIAGNOSIS — R109 Unspecified abdominal pain: Secondary | ICD-10-CM | POA: Diagnosis not present

## 2023-11-14 LAB — COMPREHENSIVE METABOLIC PANEL WITH GFR
ALT: 45 U/L — ABNORMAL HIGH (ref 0–44)
AST: 120 U/L — ABNORMAL HIGH (ref 15–41)
Albumin: 4 g/dL (ref 3.5–5.0)
Alkaline Phosphatase: 77 U/L (ref 38–126)
Anion gap: 10 (ref 5–15)
BUN: 16 mg/dL (ref 8–23)
CO2: 26 mmol/L (ref 22–32)
Calcium: 9.6 mg/dL (ref 8.9–10.3)
Chloride: 104 mmol/L (ref 98–111)
Creatinine, Ser: 1.08 mg/dL — ABNORMAL HIGH (ref 0.44–1.00)
GFR, Estimated: 52 mL/min — ABNORMAL LOW (ref >60)
Glucose, Bld: 153 mg/dL — ABNORMAL HIGH (ref 70–99)
Potassium: 4.2 mmol/L (ref 3.5–5.1)
Sodium: 140 mmol/L (ref 135–145)
Total Bilirubin: 1 mg/dL (ref 0.0–1.2)
Total Protein: 6.9 g/dL (ref 6.5–8.1)

## 2023-11-14 LAB — CBC
HCT: 44.1 % (ref 36.0–46.0)
Hemoglobin: 14.1 g/dL (ref 12.0–15.0)
MCH: 29.6 pg (ref 26.0–34.0)
MCHC: 32 g/dL (ref 30.0–36.0)
MCV: 92.6 fL (ref 80.0–100.0)
Platelets: 153 10*3/uL (ref 150–400)
RBC: 4.76 MIL/uL (ref 3.87–5.11)
RDW: 12.9 % (ref 11.5–15.5)
WBC: 7.1 10*3/uL (ref 4.0–10.5)
nRBC: 0 % (ref 0.0–0.2)

## 2023-11-14 LAB — TROPONIN I (HIGH SENSITIVITY): Troponin I (High Sensitivity): 2 ng/L (ref <18)

## 2023-11-14 LAB — URINALYSIS, ROUTINE W REFLEX MICROSCOPIC
Bilirubin Urine: NEGATIVE
Glucose, UA: NEGATIVE mg/dL
Hgb urine dipstick: NEGATIVE
Ketones, ur: NEGATIVE mg/dL
Nitrite: NEGATIVE
Protein, ur: NEGATIVE mg/dL
Specific Gravity, Urine: 1.015 (ref 1.005–1.030)
pH: 6 (ref 5.0–8.0)

## 2023-11-14 LAB — MAGNESIUM: Magnesium: 2.2 mg/dL (ref 1.7–2.4)

## 2023-11-14 LAB — LIPASE, BLOOD: Lipase: 31 U/L (ref 11–51)

## 2023-11-14 MED ORDER — IOHEXOL 350 MG/ML SOLN
70.0000 mL | Freq: Once | INTRAVENOUS | Status: AC | PRN
  Administered 2023-11-14: 70 mL via INTRAVENOUS

## 2023-11-14 NOTE — ED Triage Notes (Signed)
 Pt BIB [facility] from home with reports of epigastric abdominal pain and diaphoresis. Pt reports she had a BM this morning that relieved the pain. Pt reports that she now has lower back pain. Denies SHOB.

## 2023-11-14 NOTE — ED Notes (Signed)
Got patient hooked up to the monitor patient is resting with family at bedside and call bell in reach 

## 2023-11-14 NOTE — ED Provider Notes (Signed)
  EMERGENCY DEPARTMENT AT [facility] Provider Note   CSN: [id number] Arrival date & time: 11/14/23  1003     Patient presents with: Abdominal Pain   Heather Kirby is a 78 y.o. female.    Abdominal Pain Associated symptoms: chest pain   Patient presents for abdominal and chest pain.  Medical history includes HLD, GERD, OSA, arthritis, migraines, arthritis.  This morning 4 AM, she had some left-sided chest pain.   Chest pain resolved and she had some mild tenderness to the area which she states is normal for her.  Later in the morning, she was preparing for a road trip.  At around 8 AM, she had onset of epigastric pain radiating to her back.  She states that she has had similar pain in the past and subsequently had her gallbladder removed 1 year ago.  Pain is morning was severe.  She had associated diaphoresis.  She had a large bowel movement and this seemed to diminish the pain.  Pain is since completely resolved.  Currently, she denies any symptoms.      Prior to Admission medications   Medication Sig Start Date End Date Taking? Authorizing Provider  aspirin  EC 81 MG tablet Take 1 tablet (81 mg total) by mouth daily. Swallow whole. 08/24/23  Yes Patwardhan, Newman PARAS, MD  Calcium  Carbonate-Vit D-Min (CALCIUM  1200 PO) Take 1 tablet by mouth in the morning and at bedtime.   Yes [provider]  diphenhydramine-acetaminophen  (TYLENOL  PM) 25-500 MG TABS tablet Take 1 tablet by mouth at bedtime.   Yes [provider]  FOSAMAX 70 MG tablet Take 70 mg by mouth once a week. Monday 08/09/23  Yes [provider]  methimazole  (TAPAZOLE ) 5 MG tablet Take 2.5 mg by mouth daily.   Yes [provider]  nitroGLYCERIN  (NITROSTAT ) 0.4 MG SL tablet Place 1 tablet (0.4 mg total) under the tongue every 5 (five) minutes as needed for chest pain. 08/24/23  Yes Patwardhan, Manish J, MD  omeprazole (PRILOSEC) 40 MG capsule Take 40 mg by mouth daily. 07/19/23  Yes  [provider]  ondansetron  (ZOFRAN ) 4 MG tablet Take 4 mg by mouth every 8 (eight) hours as needed for nausea. 03/10/22  Yes [provider]  rosuvastatin  (CRESTOR ) 20 MG tablet Take 1 tablet (20 mg total) by mouth daily. Patient taking differently: Take 20 mg by mouth at bedtime. 08/24/23  Yes Patwardhan, Manish J, MD  traMADol  (ULTRAM ) 50 MG tablet Take 1 tablet (50 mg total) by mouth every 4 (four) hours as needed for moderate pain or severe pain. Patient taking differently: Take 50 mg by mouth See admin instructions. Take one tablet by mouth daily and take additional tablets throughout the day if needed 07/26/22  Yes Vicci Burnard SAUNDERS, PA-C    Allergies: Alendronate sodium and Gabapentin    Review of Systems  Constitutional:  Positive for diaphoresis.  Cardiovascular:  Positive for chest pain.  Gastrointestinal:  Positive for abdominal pain.  All other systems reviewed and are negative.   Updated Vital Signs BP (!) 160/96   Pulse 71   Temp 97.7 F (36.5 C) (Oral)   Resp 16   SpO2 99%   Physical Exam Vitals and nursing note reviewed.  Constitutional:      General: She is not in acute distress.    Appearance: She is well-developed. She is not ill-appearing, toxic-appearing or diaphoretic.  HENT:     Head: Normocephalic and atraumatic.  Mouth/Throat:     Mouth: Mucous membranes are moist.   Eyes:     Extraocular Movements: Extraocular movements intact.     Conjunctiva/sclera: Conjunctivae normal.    Cardiovascular:     Rate and Rhythm: Normal rate and regular rhythm.  Pulmonary:     Effort: Pulmonary effort is normal. No respiratory distress.  Abdominal:     General: There is no distension.     Palpations: Abdomen is soft.   Musculoskeletal:        General: No swelling.     Cervical back: Neck supple.   Skin:    General: Skin is warm and dry.     Coloration: Skin is not cyanotic or jaundiced.   Neurological:     General: No focal deficit  present.     Mental Status: She is alert and oriented to person, place, and time.   Psychiatric:        Mood and Affect: Mood normal.        Behavior: Behavior normal.     (all labs ordered are listed, but only abnormal results are displayed) Labs Reviewed  COMPREHENSIVE METABOLIC PANEL WITH GFR - Abnormal; Notable for the following components:      Result Value   Glucose, Bld 153 (*)    Creatinine, Ser 1.08 (*)    AST 120 (*)    ALT 45 (*)    GFR, Estimated 52 (*)    All other components within normal limits  URINALYSIS, ROUTINE W REFLEX MICROSCOPIC - Abnormal; Notable for the following components:   Color, Urine AMBER (*)    APPearance CLOUDY (*)    Leukocytes,Ua TRACE (*)    Bacteria, UA RARE (*)    All other components within normal limits  LIPASE, BLOOD  CBC  MAGNESIUM  TROPONIN I (HIGH SENSITIVITY)    EKG: EKG Interpretation Date/Time:  Tuesday November 14 2023 10:28:43 EDT Ventricular Rate:  65 PR Interval:  158 QRS Duration:  74 QT Interval:  444 QTC Calculation: 461 R Axis:   22  Text Interpretation: Sinus rhythm with occasional Premature ventricular complexes Otherwise normal ECG When compared with ECG of 24-Aug-2023 15:57, PREVIOUS ECG IS PRESENT Confirmed by Mannie Pac [phone]) on 11/14/2023 10:46:30 AM  Radiology: CT Angio Chest/Abd/Pel for Dissection W and/or Wo Contrast Result Date: 11/14/2023 CLINICAL DATA:  Acute aortic syndrome suspected. Epigastric and right-sided abdominal pain that woke patient from sleep. Intense pain over left side of chest. Patient reports she was so sweaty she soaked her clothes. She had a bowel movement and felt better. EXAM: CT ANGIOGRAPHY CHEST, ABDOMEN AND PELVIS TECHNIQUE: Non-contrast CT of the chest was initially obtained. Multidetector CT imaging through the chest, abdomen and pelvis was performed using the standard protocol during bolus administration of intravenous contrast. Multiplanar reconstructed images and MIPs were  obtained and reviewed to evaluate the vascular anatomy. RADIATION DOSE REDUCTION: This exam was performed according to the departmental dose-optimization program which includes automated exposure control, adjustment of the mA and/or kV according to patient size and/or use of iterative reconstruction technique. CONTRAST:  70mL OMNIPAQUE  IOHEXOL  350 MG/ML SOLN COMPARISON:  CT abdomen pelvis 07/23/2022, CT a chest 09/11/2016 in exam FINDINGS: CTA CHEST FINDINGS Cardiovascular: Heart size is normal. No increased density is seen on precontrast images within the thoracic aorta to indicate intramural hematoma. Moderate atherosclerotic calcifications within the thoracic aorta. Incidental note of some nondependent air within the main pulmonary artery, decreased compared to 08/22/2016 CT and presumably secondary to IV placement  for this exam. Mild) Tear is also seen within the right ventricle on pre contrast series 5. No pericardial effusion. Mild atherosclerotic calcifications are again seen within the left coronary artery. No thoracic aortic aneurysm. No thoracic aortic dissection. The ascending great vessels are patent, originating from a left-sided aortic arch. Normal variant origin of the left vertebral artery directly off of the aortic arch. No large central pulmonary embolism is seen. Mediastinum/Nodes: No axillary, mediastinal, or hilar pathologically enlarged lymph nodes by CT criteria. The visualized thyroid  is unremarkable. The esophagus follows a normal course of normal caliber. Lungs/Pleura: The central airways are patent. Mild biapical pleural thickening/scarring. Unchanged mild bilateral posterior dependent subpleural ground-glass opacification and reticular opacities, likely a combination of subsegmental atelectasis and or mild interstitial scarring. Unchanged mild bilateral lower lobe bronchiectasis. Benign calcified granuloma within the lateral right upper lobe (axial series 7, image 45) is unchanged. No  pleural effusion or pneumothorax. A 3 mm posterior right middle lobe nodule (axial series 7, image 84) is unchanged from 09/11/2016 and benign. No follow-up imaging is recommended. Musculoskeletal: Mild levocurvature of the upper thoracic spine. Moderate to severe C6-7 disc space narrowing endplate sclerosis. Moderate multilevel degenerative disc changes throughout the thoracic spine. There is again a lucent may angioma with trabecular thickening within the right aspect of the T7 vertebral body, not significantly changed. No aggressive lytic or blastic osseous lesion is seen. Review of the MIP images confirms the above findings. CTA ABDOMEN AND PELVIS FINDINGS VASCULAR Aorta: Normal caliber aorta without aneurysm, dissection, vasculitis or significant stenosis. Celiac: Patent without evidence of aneurysm, dissection, vasculitis or significant stenosis. SMA: Patent without evidence of aneurysm, dissection, vasculitis or significant stenosis. Renals: The origin right renal artery is widely patent. There appears to be greater than 50%a narrowing of the origin of the left renal artery (axial series 6, images 157-158). IMA: Patent without evidence of aneurysm, dissection, vasculitis or significant stenosis. Inflow: Patent without evidence of aneurysm, dissection, vasculitis or significant stenosis. Veins: Not well evaluated given lack of opacification on this arterial phase study. Review of the MIP images confirms the above findings. NON-VASCULAR Hepatobiliary: Smooth liver contours. There is again a lobular low-density cyst within the right hepatic lobe, measuring up to approximately 2.3 cm and appearing decreased in size from 07/23/2022 (axial series 6, image 143). There are new cholecystectomy clips compared to 07/23/2022. No intrahepatic or extrahepatic biliary ductal dilatation. Pancreas: Unremarkable. No pancreatic ductal dilatation or surrounding inflammatory changes. Spleen: Normal in size without focal  abnormality. Adrenals/Urinary Tract: Normal bilateral adrenals. The kidneys enhance uniformly on arterial phase and are symmetric in size without hydronephrosis. There are again bilateral parapelvic cysts. No suspicious solid lesion is identified. The ureters are normal in caliber. The urinary bladder is decompressed, limiting evaluation. Stomach/Bowel: Mild-to-moderate sigmoid diverticulosis without inflammatory changes to indicate acute diverticulitis. The terminal ileum is unremarkable normal appendix (axial series 6 images 211 through 226 and coronal series 9 images 70 through 92). No dilated loops of bowel are seen to indicate bowel obstruction. There is an air-fluid level within the previously seen diverticulum extending superiorly from the third portion of the duodenum (axial image 166 and coronal image 80). Lymphatic: No mesenteric, retroperitoneal, or pelvic pathologically enlarged lymph nodes by CT criteria. Reproductive: No free air or free fluid is seen within the abdomen or pelvis the uterus is present. No adnexal mass is seen. Other: Tiny fat containing umbilical hernia. No free air or free fluid is seen within the abdomen or pelvis. Musculoskeletal:  There is transitional lumbosacral anatomy. Moderate dextrocurvature of the upper lumbar spine. Moderate multilevel degenerative disc changes. There is lumbarization of S1. Severe L5-S1 disc space narrowing. Pain in Review of the MIP images confirms the above findings. IMPRESSION: 1. No thoracic or abdominal aortic aneurysm or dissection. 2. No acute process is seen within the chest, abdomen, or pelvis. 3. Likely greater than 50% focal narrowing of the origin of the left renal artery. 4. Mild-to-moderate sigmoid diverticulosis without inflammatory changes to indicate acute diverticulitis. 5. Interval cholecystectomy compared to 07/23/2022. Aortic Atherosclerosis (ICD10-I70.0). Electronically Signed   By: Tanda Lyons M.D.   On: 11/14/2023 14:54      Procedures   Medications Ordered in the ED  iohexol  (OMNIPAQUE ) 350 MG/ML injection 70 mL (70 mLs Intravenous Contrast Given 11/14/23 1403)                                    Medical Decision Making Amount and/or Complexity of Data Reviewed Labs: ordered. Radiology: ordered.  Risk Prescription drug management.   This patient presents to the ED for concern of chest and abdominal pain, this involves an extensive number of treatment options, and is a complaint that carries with it a high risk of complications and morbidity.  The differential diagnosis includes ACS, gastritis, GERD, pancreatitis, enteritis   Co morbidities / Chronic conditions that complicate the patient evaluation  HLD, GERD, OSA, arthritis, migraines, arthritis   Additional history obtained:  Additional history obtained from EMR External records from outside source obtained and reviewed including N/A   Lab Tests:  I Ordered, and personally interpreted labs.  The pertinent results include: Normal hemoglobin, no leukocytosis, normal kidney function, normal electrolytes, normal troponin.  Transaminases are slightly elevated, consistent with prior lab work.  Lipase and bilirubin are normal.   Imaging Studies ordered:  I ordered imaging studies including CTA dissection study I independently visualized and interpreted imaging which showed no acute findings I agree with the radiologist interpretation   Cardiac Monitoring: / EKG:  The patient was maintained on a cardiac monitor.  I personally viewed and interpreted the cardiac monitored which showed an underlying rhythm of: Sinus rhythm   Problem List / ED Course / Critical interventions / Medication management  Patient presenting for chest and abdominal pain.  Onset of chest pain was this morning at 4 AM.  Abdominal pain occurred at 8 AM and did radiate to her back.  She had associated diaphoresis.  On arrival in the ED, pain has resolved.  She is  currently asymptomatic.  She is well-appearing on exam.  Vital signs notable for moderate hypertension.  Lab work shows baseline creatinine, normal electrolytes, normal hemoglobin, no leukocytosis.  Troponin was normal as well.  Patient underwent a CT dissection study which did not show any acute findings.  Patient symptoms may have been related to gastritis/PUD.  She remained asymptomatic while in the ED.  She is currently on omeprazole daily.  She was advised to continue this and to use Maalox/Mylanta as needed.  She was discharged in stable condition.  Social Determinants of Health:  Has access to outpatient care      Final diagnoses:  Epigastric pain    ED Discharge Orders     None          Melvenia Motto, MD 11/14/23 [phone]

## 2023-11-14 NOTE — ED Provider Triage Note (Signed)
 Emergency Medicine Provider Triage Evaluation Note  Heather Kirby , a 78 y.o. female  was evaluated in triage.  Pt complains of epigastric and right sided abdominal pain that woke her from sleep, also endorsed intense pain over the left side of her chest.  Patient says that she was so sweaty, she soaked her close.  She had a bowel movement and symptoms improved.  Review of Systems  Positive:  Negative:   Physical Exam  BP 137/73 (BP Location: Right Arm)   Pulse 63   Temp 97.7 F (36.5 C) (Oral)   Resp 16   SpO2 100%  Gen:   Awake, no distress   Resp:  Normal effort  MSK:   Moves extremities without difficulty  Other:  Soft abdomen, normal heart sounds.  Medical Decision Making  Medically screening exam initiated at 10:44 AM.  Appropriate orders placed.  Heather Kirby was informed that the remainder of the evaluation will be completed by another provider, this initial triage assessment does not replace that evaluation, and the importance of remaining in the ED until their evaluation is complete.  Concerning story for sudden onset abdominal pain, chest pain, large bowel movement and sweating.  Initial EKG nonischemic.  Troponin ordered.  Imaging of the abdomen pelvis ordered.  Patient described the symptoms as being similar to when she had gallbladder pain, however gallbladder has since been removed.  Patient tells me that she has an elevated coronary artery calcium  score.   Mannie Pac T, DO 11/14/23 1046

## 2023-11-14 NOTE — ED Notes (Signed)
 Patient provided with discharge paperwork. Pt demonstrated understanding of material. All questions, comments, and concerns addressed. Pt ambulated in hallway towards exit with no complications

## 2023-11-14 NOTE — Discharge Instructions (Signed)
 Your test results today were reassuring.  Your episodes of discomfort may be related to inflammation or ulceration of your stomach.  For treatment of this, continue your daily omeprazole.  Take over-the-counter Maalox or Mylanta as needed.  Limit use of alcohol , tobacco, and NSAID medications to promote stomach healing.  Follow-up with your gastroenterologist.  Return to the emergency department for any new or worsening symptoms of concern.

## 2023-11-14 NOTE — ED Notes (Signed)
 Patient ambulated to restroom.

## 2023-11-16 ENCOUNTER — Encounter: Payer: Self-pay | Admitting: Physician Assistant

## 2023-11-26 NOTE — Progress Notes (Unsigned)
 Heather Console, PA-C [address] [place], [place]  [address] Phone: [phone]   Gastroenterology Consultation  Referring Provider:     [name], MD Primary Care Physician:  [name], MD Primary Gastroenterologist:  Heather Console, PA-C / Norleen Kiang, MD  Reason for Consultation:     ED follow-up epigastric pain        HPI:   Heather Kirby is a 78 y.o. y/o female referred for consultation & management  by [name], MD.    Patient went to [facility] ED 11/14/2023 to evaluate acute epigastric abdominal pain and chest pain.  Epigastric pain radiated to her back.  Prior cholecystectomy 07/2022.  ECG unremarkable.  CTA chest abdomen pelvis negative for aortic aneurysm or dissection.  No acute abnormality.  No evidence of bowel obstruction or diverticulitis.  No biliary duct dilatation.  Labs significant for elevated AST 128, ALT 45.  Normal alk phos 77, total bilirubin 1.0.  Glucose 153.  Normal magnesium.  Normal lipase 31.  Normal CBC with WBC 7.1, Hgb 14.1.    Current symptoms:  Upper abdominal pain has improved, yet not resolved.  Currently 3/10.  Located in the RUQ, Epigastrium, and LUQ.  Denies lower abdominal pain.  Has nausea, but no vomiting.  Patient states she had a huge bowel movement while in the ED which helped relieve her upper abdominal pain.  She denies history of constipation which she attributes to taking tramadol .  Typically has small hard balls of stool.  She tried Linzess in the past which was too expensive.  Most recently tried OTC MiraLAX which caused fecal incontinence.  Has not had a good bowel movement in the past week.  Currently takes omeprazole 40 Mg once daily for GERD and Zofran  4 Mg as needed for nausea.  Her acid reflux is not controlled on omeprazole.  She reports breakthrough heartburn.  Alcohol :  1 drink of wine / alcohol  per month.  Review of records shows she has had transient elevated liver transaminases since 2022.  Also  thrombocytopenia.  05/2020 EGD by Dr. Kiang (to evaluate epigastric pain): Normal.  03/2016 last colonoscopy by Dr. Dyane at [facility]: Normal.  No polyps.  Good prep.  10-year repeat (due 03/2026).  PMH: Hyperlipidemia, GERD, sleep apnea, arthritis, migraines, thrombocytopenia.  She is followed by cardiologist Dr. Elmira.  Last saw cardiologist 08/24/2023.  Cardiac evaluation for chest pain unrevealing.  Takes 81 mg aspirin  daily.  Coronary CT angiogram showed no significant stenosis.  11/14/2023 CTA chest, abdomen, pelvis: 1. No thoracic or abdominal aortic aneurysm or dissection. 2. No acute process is seen within the chest, abdomen, or pelvis. 3. Likely greater than 50% focal narrowing of the origin of the left renal artery. 4. Mild-to-moderate sigmoid diverticulosis without inflammatory changes to indicate acute diverticulitis. 5. Interval cholecystectomy compared to 07/23/2022. 6. Aortic Atherosclerosis. 7. Hepatobiliary: Smooth liver contours. There is again a lobular low-density cyst within the right hepatic lobe, measuring up to approximately 2.3 cm and appearing decreased in size from 07/23/2022.  There are new cholecystectomy clips compared to 07/23/2022. No intrahepatic or extrahepatic biliary ductal dilatation.  Past Medical History:  Diagnosis Date   Arthritis    GERD (gastroesophageal reflux disease)    HLD (hyperlipidemia)    Hyperthyroidism    Sleep apnea    Thrombocytopenia (HCC)     Past Surgical History:  Procedure Laterality Date   CATARACT EXTRACTION W/ INTRAOCULAR LENS  IMPLANT, BILATERAL  right 8/18 and left was 9/18   CERVICAL ABLATION     irregular pap smear  11/2021   CHOLECYSTECTOMY N/A 07/25/2022   Procedure: LAPAROSCOPIC CHOLECYSTECTOMY;  Surgeon: Sebastian Moles, MD;  Location: [facility] OR;  Service: General;  Laterality: N/A;   COLONOSCOPY     SHOULDER ARTHROSCOPY Right     Prior to Admission medications   Medication Sig Start Date End Date Taking? Authorizing  Provider  aspirin  EC 81 MG tablet Take 1 tablet (81 mg total) by mouth daily. Swallow whole. 08/24/23   Patwardhan, Newman PARAS, MD  Calcium  Carbonate-Vit D-Min (CALCIUM  1200 PO) Take 1 tablet by mouth in the morning and at bedtime.    [provider]  diphenhydramine-acetaminophen  (TYLENOL  PM) 25-500 MG TABS tablet Take 1 tablet by mouth at bedtime.    [provider]  FOSAMAX 70 MG tablet Take 70 mg by mouth once a week. Monday 08/09/23   [provider]  methimazole  (TAPAZOLE ) 5 MG tablet Take 2.5 mg by mouth daily.    [provider]  nitroGLYCERIN  (NITROSTAT ) 0.4 MG SL tablet Place 1 tablet (0.4 mg total) under the tongue every 5 (five) minutes as needed for chest pain. 08/24/23   Patwardhan, Newman PARAS, MD  omeprazole (PRILOSEC) 40 MG capsule Take 40 mg by mouth daily. 07/19/23   [provider]  ondansetron  (ZOFRAN ) 4 MG tablet Take 4 mg by mouth every 8 (eight) hours as needed for nausea. 03/10/22   [provider]  rosuvastatin  (CRESTOR ) 20 MG tablet Take 1 tablet (20 mg total) by mouth daily. Patient taking differently: Take 20 mg by mouth at bedtime. 08/24/23   Patwardhan, Newman PARAS, MD  traMADol  (ULTRAM ) 50 MG tablet Take 1 tablet (50 mg total) by mouth every 4 (four) hours as needed for moderate pain or severe pain. Patient taking differently: Take 50 mg by mouth See admin instructions. Take one tablet by mouth daily and take additional tablets throughout the day if needed 07/26/22   Vicci Burnard SAUNDERS, PA-C    Family History  Problem Relation Age of Onset   Heart failure Mother    Hypertension Mother    Diabetes Father    Heart disease Father    Breast cancer Sister    Heart disease Sister    Hiatal hernia Maternal Grandmother    Heart disease Maternal Aunt    Stomach cancer Neg Hx    Rectal cancer Neg Hx    Esophageal cancer Neg Hx    Colon cancer Neg Hx      Social History   Tobacco Use   Smoking status: Never   Smokeless  tobacco: Never  Vaping Use   Vaping status: Never Used  Substance Use Topics   Alcohol  use: Yes    Comment: occasional wine   Drug use: No    Allergies as of 11/27/2023 - Review Complete 11/27/2023  Allergen Reaction Noted   Alendronate sodium  01/15/2021   Gabapentin  01/15/2021   Sulfa antibiotics  11/27/2023    Review of Systems:    All systems reviewed and negative except where noted in HPI.   Physical Exam:  BP 112/62   Pulse 75   Ht 5' 6.75 (1.695 m)   Wt 140 lb (63.5 kg)   SpO2 99%   BMI 22.09 kg/m  No LMP recorded. Patient is postmenopausal.  General:   Alert,  Well-developed, well-nourished, pleasant and cooperative in NAD Lungs:  Respirations even and unlabored.  Clear throughout to auscultation.  No wheezes, crackles, or rhonchi. No acute distress. Heart:  Regular rate and rhythm; no murmurs, clicks, rubs, or gallops. Abdomen:  Normal bowel sounds.  No bruits.  Soft, and non-distended without masses, hepatosplenomegaly or hernias noted.  Mild generalized upper abdominal Tenderness in the RUQ, Epigastrium, and LUQ.  NO lower abdominal tenderness.  No guarding or rebound tenderness.    Neurologic:  Alert and oriented x3;  grossly normal neurologically. Psych:  Alert and cooperative. Anxious mood and affect.  Imaging Studies: CT Angio Chest/Abd/Pel for Dissection W and/or Wo Contrast Result Date: 11/14/2023 CLINICAL DATA:  Acute aortic syndrome suspected. Epigastric and right-sided abdominal pain that woke patient from sleep. Intense pain over left side of chest. Patient reports she was so sweaty she soaked her clothes. She had a bowel movement and felt better. EXAM: CT ANGIOGRAPHY CHEST, ABDOMEN AND PELVIS TECHNIQUE: Non-contrast CT of the chest was initially obtained. Multidetector CT imaging through the chest, abdomen and pelvis was performed using the standard protocol during bolus administration of intravenous contrast. Multiplanar reconstructed images and MIPs  were obtained and reviewed to evaluate the vascular anatomy. RADIATION DOSE REDUCTION: This exam was performed according to the departmental dose-optimization program which includes automated exposure control, adjustment of the mA and/or kV according to patient size and/or use of iterative reconstruction technique. CONTRAST:  70mL OMNIPAQUE  IOHEXOL  350 MG/ML SOLN COMPARISON:  CT abdomen pelvis 07/23/2022, CT a chest 09/11/2016 in exam FINDINGS: CTA CHEST FINDINGS Cardiovascular: Heart size is normal. No increased density is seen on precontrast images within the thoracic aorta to indicate intramural hematoma. Moderate atherosclerotic calcifications within the thoracic aorta. Incidental note of some nondependent air within the main pulmonary artery, decreased compared to 08/22/2016 CT and presumably secondary to IV placement for this exam. Mild) Tear is also seen within the right ventricle on pre contrast series 5. No pericardial effusion. Mild atherosclerotic calcifications are again seen within the left coronary artery. No thoracic aortic aneurysm. No thoracic aortic dissection. The ascending great vessels are patent, originating from a left-sided aortic arch. Normal variant origin of the left vertebral artery directly off of the aortic arch. No large central pulmonary embolism is seen. Mediastinum/Nodes: No axillary, mediastinal, or hilar pathologically enlarged lymph nodes by CT criteria. The visualized thyroid  is unremarkable. The esophagus follows a normal course of normal caliber. Lungs/Pleura: The central airways are patent. Mild biapical pleural thickening/scarring. Unchanged mild bilateral posterior dependent subpleural ground-glass opacification and reticular opacities, likely a combination of subsegmental atelectasis and or mild interstitial scarring. Unchanged mild bilateral lower lobe bronchiectasis. Benign calcified granuloma within the lateral right upper lobe (axial series 7, image 45) is unchanged.  No pleural effusion or pneumothorax. A 3 mm posterior right middle lobe nodule (axial series 7, image 84) is unchanged from 09/11/2016 and benign. No follow-up imaging is recommended. Musculoskeletal: Mild levocurvature of the upper thoracic spine. Moderate to severe C6-7 disc space narrowing endplate sclerosis. Moderate multilevel degenerative disc changes throughout the thoracic spine. There is again a lucent may angioma with trabecular thickening within the right aspect of the T7 vertebral body, not significantly changed. No aggressive lytic or blastic osseous lesion is seen. Review of the MIP images confirms the above findings. CTA ABDOMEN AND PELVIS FINDINGS VASCULAR Aorta: Normal caliber aorta without aneurysm, dissection, vasculitis or significant stenosis. Celiac: Patent without evidence of aneurysm, dissection, vasculitis or significant stenosis. SMA: Patent without evidence of aneurysm, dissection, vasculitis or significant stenosis. Renals: The origin right renal artery is widely patent. There appears to  be greater than 50%a narrowing of the origin of the left renal artery (axial series 6, images 157-158). IMA: Patent without evidence of aneurysm, dissection, vasculitis or significant stenosis. Inflow: Patent without evidence of aneurysm, dissection, vasculitis or significant stenosis. Veins: Not well evaluated given lack of opacification on this arterial phase study. Review of the MIP images confirms the above findings. NON-VASCULAR Hepatobiliary: Smooth liver contours. There is again a lobular low-density cyst within the right hepatic lobe, measuring up to approximately 2.3 cm and appearing decreased in size from 07/23/2022 (axial series 6, image 143). There are new cholecystectomy clips compared to 07/23/2022. No intrahepatic or extrahepatic biliary ductal dilatation. Pancreas: Unremarkable. No pancreatic ductal dilatation or surrounding inflammatory changes. Spleen: Normal in size without focal  abnormality. Adrenals/Urinary Tract: Normal bilateral adrenals. The kidneys enhance uniformly on arterial phase and are symmetric in size without hydronephrosis. There are again bilateral parapelvic cysts. No suspicious solid lesion is identified. The ureters are normal in caliber. The urinary bladder is decompressed, limiting evaluation. Stomach/Bowel: Mild-to-moderate sigmoid diverticulosis without inflammatory changes to indicate acute diverticulitis. The terminal ileum is unremarkable normal appendix (axial series 6 images 211 through 226 and coronal series 9 images 70 through 92). No dilated loops of bowel are seen to indicate bowel obstruction. There is an air-fluid level within the previously seen diverticulum extending superiorly from the third portion of the duodenum (axial image 166 and coronal image 80). Lymphatic: No mesenteric, retroperitoneal, or pelvic pathologically enlarged lymph nodes by CT criteria. Reproductive: No free air or free fluid is seen within the abdomen or pelvis the uterus is present. No adnexal mass is seen. Other: Tiny fat containing umbilical hernia. No free air or free fluid is seen within the abdomen or pelvis. Musculoskeletal: There is transitional lumbosacral anatomy. Moderate dextrocurvature of the upper lumbar spine. Moderate multilevel degenerative disc changes. There is lumbarization of S1. Severe L5-S1 disc space narrowing. Pain in Review of the MIP images confirms the above findings. IMPRESSION: 1. No thoracic or abdominal aortic aneurysm or dissection. 2. No acute process is seen within the chest, abdomen, or pelvis. 3. Likely greater than 50% focal narrowing of the origin of the left renal artery. 4. Mild-to-moderate sigmoid diverticulosis without inflammatory changes to indicate acute diverticulitis. 5. Interval cholecystectomy compared to 07/23/2022. Aortic Atherosclerosis (ICD10-I70.0). Electronically Signed   By: Tanda Lyons M.D.   On: 11/14/2023 14:54     Labs: CBC    Component Value Date/Time   WBC 7.1 11/14/2023 1012   RBC 4.76 11/14/2023 1012   HGB 14.1 11/14/2023 1012   HCT 44.1 11/14/2023 1012   PLT 153 11/14/2023 1012   MCV 92.6 11/14/2023 1012   MCH 29.6 11/14/2023 1012   MCHC 32.0 11/14/2023 1012   RDW 12.9 11/14/2023 1012   LYMPHSABS 0.9 07/23/2022 0607   MONOABS 0.6 07/23/2022 0607   EOSABS 0.2 07/23/2022 0607   BASOSABS 0.0 07/23/2022 0607    CMP     Component Value Date/Time   NA 140 11/14/2023 1012   NA 136 08/24/2023 1647   K 4.2 11/14/2023 1012   CL 104 11/14/2023 1012   CO2 26 11/14/2023 1012   GLUCOSE 153 (H) 11/14/2023 1012   BUN 16 11/14/2023 1012   BUN 12 08/24/2023 1647   CREATININE 1.08 (H) 11/14/2023 1012   CALCIUM  9.6 11/14/2023 1012   PROT 6.9 11/14/2023 1012   ALBUMIN 4.0 11/14/2023 1012   AST 120 (H) 11/14/2023 1012   ALT 45 (H) 11/14/2023 1012  ALKPHOS 77 11/14/2023 1012   BILITOT 1.0 11/14/2023 1012   GFRNONAA 52 (L) 11/14/2023 1012   GFRAA 53 (L) 09/12/2016 0237    Assessment and Plan:   REKIA KUJALA is a 78 y.o. y/o female has been referred for:  1.  Epigastric pain s/p cholecystectomy 07/2022.  She Has had chronic, intermittent epigastric pain for several years.  EGD in 2022 was normal.  Differential Diagnosis includes: GERD, Gastritis, PUD, H. Pylori, Constipation, and Bile Duct stone s/p CCY 07/2022.  Upper abdominal pain has currently improved.  Relieved after BM.  Recent Lipase normal and CT showed no evidence of pancreatitis. - Lab: Repeat Hepatic Panal;  If LFTs still elevated, then order Abd MRI / MRCP to evaluate for bile duct stone. - Continue Omeprazole 40mg  1 capsule once daily - Add Pepcid  (Famotidine ) 20mg  BID before lunch and dinner - Avoid NSAIDs and Alcohol . - Diatherix H. pylori stool test - If upper abdominal pain persists, then Repeat EGD is next step.  2.  GERD / Noncardiac chest pain; cardiac evaluation unrevealing.  Followed by cardiology. - Continue  Omeprazole 40mg  1 capsule once daily - Add Pepcid  (Famotidine ) 20mg  BID before lunch and dinner  3.  Elevated liver transaminases AST greater than ALT; Chronically Elevated Liver Transaminases since 2022. - Avoid alcohol  - Labs: Hepatic Panal, ANA, AMA, ASMA, ceruloplasmin, viral hepatitis A/B/C, iron panel, ferritin, immunoglobulins, alpha-1 antitrypsin  - If LFTs remain elevated, then Order abdominal MRI/MRCP: Evaluate for bile duct stone and Hepatic Steatosis.  4.  Chronic Constipation - Abd Xray - If Xray shows constipation, then Rx Amitiza  24mcg BID. - Linzess was cost prohibitive. - Miralax caused loose stool and fecal incontinence.  Follow up 4 weeks with TG  Heather Console, PA-C

## 2023-11-27 ENCOUNTER — Ambulatory Visit: Admitting: Physician Assistant

## 2023-11-27 ENCOUNTER — Ambulatory Visit (INDEPENDENT_AMBULATORY_CARE_PROVIDER_SITE_OTHER)
Admission: RE | Admit: 2023-11-27 | Discharge: 2023-11-27 | Disposition: A | Discharge status: Home / Self Care | Source: Ambulatory Visit | Attending: Physician Assistant | Admitting: Physician Assistant

## 2023-11-27 ENCOUNTER — Other Ambulatory Visit (INDEPENDENT_AMBULATORY_CARE_PROVIDER_SITE_OTHER)

## 2023-11-27 ENCOUNTER — Encounter: Payer: Self-pay | Admitting: Physician Assistant

## 2023-11-27 VITALS — BP 112/62 | HR 75 | Ht 66.75 in | Wt 140.0 lb

## 2023-11-27 DIAGNOSIS — R1013 Epigastric pain: Secondary | ICD-10-CM | POA: Diagnosis not present

## 2023-11-27 DIAGNOSIS — Z9049 Acquired absence of other specified parts of digestive tract: Secondary | ICD-10-CM | POA: Diagnosis not present

## 2023-11-27 DIAGNOSIS — R7401 Elevation of levels of liver transaminase levels: Secondary | ICD-10-CM | POA: Diagnosis not present

## 2023-11-27 DIAGNOSIS — K5904 Chronic idiopathic constipation: Secondary | ICD-10-CM | POA: Diagnosis not present

## 2023-11-27 DIAGNOSIS — G8929 Other chronic pain: Secondary | ICD-10-CM

## 2023-11-27 DIAGNOSIS — K219 Gastro-esophageal reflux disease without esophagitis: Secondary | ICD-10-CM

## 2023-11-27 DIAGNOSIS — K5900 Constipation, unspecified: Secondary | ICD-10-CM | POA: Diagnosis not present

## 2023-11-27 DIAGNOSIS — K5909 Other constipation: Secondary | ICD-10-CM | POA: Diagnosis not present

## 2023-11-27 DIAGNOSIS — R7989 Other specified abnormal findings of blood chemistry: Secondary | ICD-10-CM

## 2023-11-27 DIAGNOSIS — R0789 Other chest pain: Secondary | ICD-10-CM | POA: Diagnosis not present

## 2023-11-27 LAB — HEPATIC FUNCTION PANEL
ALT: 13 U/L (ref 0–35)
AST: 20 U/L (ref 0–37)
Albumin: 4.6 g/dL (ref 3.5–5.2)
Alkaline Phosphatase: 66 U/L (ref 39–117)
Bilirubin, Direct: 0.1 mg/dL (ref 0.0–0.3)
Total Bilirubin: 0.5 mg/dL (ref 0.2–1.2)
Total Protein: 7.2 g/dL (ref 6.0–8.3)

## 2023-11-27 MED ORDER — FAMOTIDINE 20 MG PO TABS: 20.0000 mg | ORAL_TABLET | Freq: Two times a day (BID) | ORAL | 5 refills | Status: DC

## 2023-11-27 NOTE — Progress Notes (Signed)
 Noted

## 2023-11-27 NOTE — Patient Instructions (Signed)
 We have sent the following medications to your pharmacy for you to pick up at your convenience: Famotidine  20 mg twice daily  Your provider has requested that you go to the basement level for lab work before leaving today. Press B on the elevator. The lab is located at the first door on the left as you exit the elevator.  Your provider has requested that you go to the basement level for an X-ray before leaving today. Press B on the elevator. The Radiology/X-ray department is located at the second door on the right as you exit straight off the elevator.  Your provider has ordered Diatherix stool testing for you. You have received a kit from our office today containing all necessary supplies to complete this test. Please carefully read the stool collection instructions provided in the kit before opening the accompanying materials. In addition, be sure there is a label providing your full name and date of birth on the puritan opti-swab tube that is supplied in the kit (if you do not see a label with this information on your test tube, please make us  aware before test collection!). After completing the test, you should secure the purtian tube into the specimen biohazard bag. The [facility] E-Req sheet (including date and time of specimen collection) should be placed into the outside pocket of the specimen biohazard bag and returned to the [place] lab (basement floor of [facility]) within 3 days of collection. Please make sure to give the specimen to a staff member at the lab. DO NOT leave the specimen on the counter.   If the specimen date and time (can be found in the upper right boxed portion of the sheet) are not filled out on the E-Req sheet, the test will NOT be performed.   Please follow up sooner if symptoms increase or worsen  Due to recent changes in healthcare laws, you may see the results of your imaging and laboratory studies on MyChart before your  provider has had a chance to review them.  We understand that in some cases there may be results that are confusing or concerning to you. Not all laboratory results come back in the same time frame and the provider may be waiting for multiple results in order to interpret others.  Please give us  48 hours in order for your provider to thoroughly review all the results before contacting the office for clarification of your results.   Thank you for trusting me with your gastrointestinal care!   Ellouise Console, PA-C _______________________________________________________  If your blood pressure at your visit was 140/90 or greater, please contact your primary care physician to follow up on this.  _______________________________________________________  If you are age 79 or older, your body mass index should be between 23-30. Your Body mass index is 22.09 kg/m. If this is out of the aforementioned range listed, please consider follow up with your Primary Care Provider.  If you are age 10 or younger, your body mass index should be between 19-25. Your Body mass index is 22.09 kg/m. If this is out of the aformentioned range listed, please consider follow up with your Primary Care Provider.   ________________________________________________________  The [facility] providers would like to encourage you to use MYCHART to communicate with providers for non-urgent requests or questions.  Due to long hold times on the telephone, sending your provider a message by [facility] may be a faster and more efficient way to get a response.  Please allow  48 business hours for a response.  Please remember that this is for non-urgent requests.  _______________________________________________________

## 2023-11-28 DIAGNOSIS — R1013 Epigastric pain: Secondary | ICD-10-CM | POA: Diagnosis not present

## 2023-11-29 ENCOUNTER — Ambulatory Visit: Payer: Self-pay | Admitting: Physician Assistant

## 2023-11-29 MED ORDER — LUBIPROSTONE 24 MCG PO CAPS: 24.0000 ug | ORAL_CAPSULE | Freq: Two times a day (BID) | ORAL | 5 refills | Status: DC

## 2023-11-29 NOTE — Progress Notes (Signed)
 Call and notify patient: Abdominal x-ray shows large amount of stool throughout the entire colon, consistent with constipation.  No evidence of bowel obstruction.  Constipation is likely causing her abdominal pain. Please send prescription for Amitiza  24 mcg 1 capsule twice daily, #60, 5 refills.  Tell patient to take Amitiza  with food. Ellouise Console, PA-C

## 2023-11-30 LAB — HEPATITIS B SURFACE ANTIBODY,QUALITATIVE: Hep B S Ab: NONREACTIVE

## 2023-11-30 LAB — ANA: Anti Nuclear Antibody (ANA): POSITIVE — AB

## 2023-11-30 LAB — IMMUNOGLOBULINS A/E/G/M, SERUM
IgA/Immunoglobulin A, Serum: 150 mg/dL (ref 64–422)
IgE (Immunoglobulin E), Serum: 68 [IU]/mL (ref 6–495)
IgG (Immunoglobin G), Serum: 777 mg/dL (ref 586–1602)
IgM (Immunoglobulin M), Srm: 34 mg/dL (ref 26–217)

## 2023-11-30 LAB — IRON,TIBC AND FERRITIN PANEL
%SAT: 27 % (ref 16–45)
Ferritin: 84 ng/mL (ref 16–288)
Iron: 89 ug/dL (ref 45–160)
TIBC: 335 ug/dL (ref 250–450)

## 2023-11-30 LAB — MITOCHONDRIAL ANTIBODIES: Mitochondrial M2 Ab, IgG: <=20 U (ref <=20.0)

## 2023-11-30 LAB — HEPATITIS B CORE ANTIBODY, IGM: Hep B C IgM: NONREACTIVE

## 2023-11-30 LAB — ANTI-SMOOTH MUSCLE ANTIBODY, IGG: Actin (Smooth Muscle) Antibody (IGG): <20 U (ref <20)

## 2023-11-30 LAB — CERULOPLASMIN: Ceruloplasmin: 20 mg/dL (ref 14–48)

## 2023-11-30 LAB — HEPATITIS A ANTIBODY, IGM: Hep A IgM: NONREACTIVE

## 2023-11-30 LAB — HEPATITIS A ANTIBODY, TOTAL: Hepatitis A AB,Total: REACTIVE — AB

## 2023-11-30 LAB — ANTI-NUCLEAR AB-TITER (ANA TITER): ANA Titer 1: 1:320 {titer} — ABNORMAL HIGH

## 2023-11-30 LAB — HEPATITIS B CORE ANTIBODY, TOTAL: Hep B Core Total Ab: NONREACTIVE

## 2023-11-30 LAB — ALPHA-1-ANTITRYPSIN: A-1 Antitrypsin, Ser: 164 mg/dL (ref 83–199)

## 2023-11-30 LAB — HEPATITIS C ANTIBODY: Hepatitis C Ab: NONREACTIVE

## 2023-12-06 ENCOUNTER — Ambulatory Visit: Attending: Cardiovascular Disease | Admitting: Cardiology

## 2023-12-06 ENCOUNTER — Encounter: Payer: Self-pay | Admitting: Cardiology

## 2023-12-06 ENCOUNTER — Telehealth: Payer: Self-pay | Admitting: Physician Assistant

## 2023-12-06 VITALS — BP 122/64 | HR 73 | Ht 66.0 in | Wt 139.0 lb

## 2023-12-06 DIAGNOSIS — Q2112 Patent foramen ovale: Secondary | ICD-10-CM | POA: Diagnosis not present

## 2023-12-06 DIAGNOSIS — E782 Mixed hyperlipidemia: Secondary | ICD-10-CM | POA: Diagnosis not present

## 2023-12-06 DIAGNOSIS — I2510 Atherosclerotic heart disease of native coronary artery without angina pectoris: Secondary | ICD-10-CM | POA: Insufficient documentation

## 2023-12-06 MED ORDER — CLOPIDOGREL BISULFATE 75 MG PO TABS: 75.0000 mg | ORAL_TABLET | Freq: Every day | ORAL | 3 refills | Status: AC

## 2023-12-06 MED ORDER — PANTOPRAZOLE SODIUM 40 MG PO TBEC: 40.0000 mg | DELAYED_RELEASE_TABLET | Freq: Every day | ORAL | 3 refills | Status: DC

## 2023-12-06 NOTE — Progress Notes (Signed)
 Cardiology Office Note:  .   Date:  12/06/2023  ID:  [name], DOB June 03, 1945, MRN [id number] PCP: [name], MD  [facility] Providers Cardiologist:  Newman Lawrence, MD PCP: [name], MD  Chief Complaint  Patient presents with   Coronary Artery Disease     Heather Kirby is a 78 y.o. female with stable thrombocytopenia, nonobstructive CAD, incidental findings of PFO and possible left renal stenosis   Patient was recently seen in the emergency room for complaints of abdominal/epigastric pain.  No acute findings were noted on CT scan, but she was incidentally found to have left renal stenosis.  She was seen by GI subsequently and recommended PPI and Pepcid  for possible GERD, with potential EGD in the future.  Elevated LFTs were chronic.  MRI/MRCP was recommended.  Prior to that, patient underwent: CT angiography in 09/2023 that showed moderate nonobstructive coronary artery disease and incidental finding of PFO.   Vitals:   12/06/23 1014  BP: 122/64  Pulse: 73  SpO2: 96%       Review of Systems  Cardiovascular:  Positive for chest pain. Negative for dyspnea on exertion, leg swelling, palpitations and syncope.        Studies Reviewed: SABRA        EKG 08/24/2023: Normal sinus rhythm Normal ECG When compared with ECG of 23-Jul-2022 06:13,  T wave inverted in V1, normal    Labs 04/2023: Chol 130, TG 57, HDL 48, LDL 71 Hb 14.1 Cr 1.0 TSH 2.7  07/2022: Hb 14.9 Cr 1.05 AST/ALT 71/65, protein 5.1  CTA C/A/P 11/2023: 1. No thoracic or abdominal aortic aneurysm or dissection. 2. No acute process is seen within the chest, abdomen, or pelvis. 3. Likely greater than 50% focal narrowing of the origin of the left renal artery. 4. Mild-to-moderate sigmoid diverticulosis without inflammatory changes to indicate acute diverticulitis. 5. Interval cholecystectomy compared to 07/23/2022.   Aortic Atherosclerosis  Cor CTA 09/2023: 1.  Coronary calcium  score of 95.1. This was 50th percentile for age and sex matched control.  2. Total plaque volume 79 mm3 which is 17th percentile for age- and sex-matched controls. TPV is mild.  3. Normal coronary origins with right dominance.  4. CAD-RADS 3 Moderate coronary artery disease. 5. Moderate stenosis (50-69%) closer to 50% due to mixed plaque in the proximal LAD. Mild stenosis (25-49%) due to soft plaque at the ostium of first diagonal branch.  6. CT FFR will be performed and reported separately to further evaluate the proximal LAD and D1.  7. Aortic atherosclerosis.  8. Patent foramen ovale.  CT FFR 09/2023: 1. Left Main: FFR = 1.0  2. LAD: Proximal FFR = 0.93, mid FFR = 0.90, Distal FFR = not modeled due to vessel size. 3. D1: Not modeled due to vessel size. 4. LCX: Proximal FFR = 0.99, distal FFR = not modeled due to vessel size. 5. OM1: Proximal FFR = 0.99, distal FFR = 0.90 6. RCA: Proximal FFR = 1.0, Mid FFR =0.95, Distal FFR = 0.93   IMPRESSION: 1. CT FFR analysis showed no significant stenosis within the proximal to mid LAD.  2.  CT FFR analysis not performed for D1 due to vessel size.  CT cardiac scoring 08/2023: Total score 83 (all in LAD) 50th percentile  Exercise nuclear stress test 08/30/2022: Myocardial perfusion is normal. Overall LV systolic function is normal without regional wall motion abnormalities. Stress LV EF: 54%. Low risk study. Nondiagnostic ECG stress due to target  HR not achieved (79% MPHR). The heart rate response was normal. The blood pressure response was normal. No previous exam available for comparison.       Physical Exam Vitals and nursing note reviewed.  Constitutional:      General: She is not in acute distress. Neck:     Vascular: No JVD.  Cardiovascular:     Rate and Rhythm: Normal rate and regular rhythm.     Heart sounds: Normal heart sounds. No murmur heard. Pulmonary:     Effort: Pulmonary effort is normal.      Breath sounds: Normal breath sounds. No wheezing or rales.  Musculoskeletal:     Right lower leg: No edema.     Left lower leg: No edema.      VISIT DIAGNOSES:   ICD-10-CM   1. Coronary artery disease involving native coronary artery of native heart without angina pectoris  I25.10     2. Mixed hyperlipidemia  E78.2     3. PFO (patent foramen ovale)  Q21.12         Heather Kirby is a 78 y.o. female with stable thrombocytopenia, nonobstructive CAD, incidental findings of PFO and possible left renal stenosis  Assessment & Plan  CAD: Moderate nonobstructive CAD, including proximal LAD/diagonal. Recommend aggressive risk factor modification. Given her notably, recommend switching aspirin  81 mg daily to Plavix  75 mg daily. With initiation of Plavix , I have changed omeprazole to pantoprazole . LDL 71 on rosuvastatin  20 mg daily.  Will check lipid panel, LDL lower the better. In future, if LFTs become an issue, could consider lowering statin and consider adding injectable agents.  PFO: Incidental finding of no clinical significance.  Left renal stenosis: Incidental finding.  Normotensive, normal renal function.  No further workup necessary.   Meds ordered this encounter  Medications   clopidogrel  (PLAVIX ) 75 MG tablet    Sig: Take 1 tablet (75 mg total) by mouth daily.    Dispense:  90 tablet    Refill:  3   pantoprazole  (PROTONIX ) 40 MG tablet    Sig: Take 1 tablet (40 mg total) by mouth daily.    Dispense:  90 tablet    Refill:  3     F/u in 3 months  Signed, Newman JINNY Lawrence, MD

## 2023-12-06 NOTE — Patient Instructions (Signed)
 Medication Instructions:   STOP aspirin    STOP prilosec  START Plavix  75 mg daily  START Protonix  40 mg daily  *If you need a refill on your cardiac medications before your next appointment, please call your pharmacy*  Lab Work: Lipid panel   If you have labs (blood work) drawn today and your tests are completely normal, you will receive your results only by: MyChart Message (if you have MyChart) OR A paper copy in the mail If you have any lab test that is abnormal or we need to change your treatment, we will call you to review the results.  Your next appointment:   1 year(s)  Provider:   Newman JINNY Lawrence, MD

## 2023-12-06 NOTE — Telephone Encounter (Signed)
 Call and notify patient Heather Kirby stool test is negative. This is good news.  Continue with current plan. Ellouise Console, PA-C

## 2023-12-07 LAB — LIPID PANEL
Chol/HDL Ratio: 2.4 ratio (ref 0.0–4.4)
Cholesterol, Total: 131 mg/dL (ref 100–199)
HDL: 54 mg/dL (ref >39)
LDL Chol Calc (NIH): 58 mg/dL (ref 0–99)
Triglycerides: 102 mg/dL (ref 0–149)
VLDL Cholesterol Cal: 19 mg/dL (ref 5–40)

## 2023-12-08 ENCOUNTER — Ambulatory Visit: Payer: Self-pay | Admitting: Cardiology

## 2023-12-25 ENCOUNTER — Encounter: Payer: Self-pay | Admitting: Physician Assistant

## 2023-12-26 ENCOUNTER — Encounter: Payer: Self-pay | Admitting: Podiatry

## 2023-12-26 ENCOUNTER — Ambulatory Visit: Admitting: Podiatry

## 2023-12-26 DIAGNOSIS — M79676 Pain in unspecified toe(s): Secondary | ICD-10-CM

## 2023-12-26 DIAGNOSIS — B351 Tinea unguium: Secondary | ICD-10-CM

## 2023-12-27 NOTE — Progress Notes (Signed)
 Presents today chief complaint of painful elongated toenails.  Objective: Pulses are palpable no open lesions or wounds.  Toenails are long thick yellow dystrophic onychomycotic.  Assessment: Pain limb secondary onychomycosis.  Plan: Debridement of toenails 1 through 5 bilateral.

## 2024-01-10 ENCOUNTER — Ambulatory Visit: Admitting: Physician Assistant

## 2024-01-22 ENCOUNTER — Ambulatory Visit: Admitting: Physician Assistant

## 2024-01-31 DIAGNOSIS — G4733 Obstructive sleep apnea (adult) (pediatric): Secondary | ICD-10-CM | POA: Diagnosis not present

## 2024-03-21 NOTE — Progress Notes (Unsigned)
 Heather Console, PA-C [address] [place], [place]  [address] Phone: [phone]   Primary Care Physician: [name], MD  Primary Gastroenterologist:  Heather Console, PA-C / Norleen Kiang, MD   Chief Complaint: Follow-up chronic epigastric pain, GERD, elevated LFTs, and constipation.   HPI:   Discussed the use of AI scribe software for clinical note transcription with the patient, who gave verbal consent to proceed.  78 year old female returns for 31-month follow-up of chronic epigastric pain, GERD, elevated LFTs, and constipation.  She is currently taking pantoprazole  40 mg once daily and Pepcid  20 mg twice daily.  Also taking Amitiza  24 mcg once or twice daily as needed.  Her H. pylori test was negative, and her liver tests have normalized.   Her symptoms are controlled and doing well on current treatment.  She has no abdominal pain or acid reflux at present.  No GI symptoms or concerns.  11/28/2023 abdominal x-ray: Large amount of stool throughout the colon, consistent with constipation.  No obstruction.  She was started on Amitiza  24 mg twice daily.  11/2023 [facility] H. pylori stool test was negative.  11/27/2023 labs: Mild positive ANA.  Negative AMA and ASMA.  Normal ceruloplasmin, iron panel, and alpha 1 antitrypsin.  Negative acute viral hepatitis A, B, and C labs.  Immune to hepatitis A.  Not immune to hepatitis B.  All liver test returned to normal.  She has a history of osteoporosis and is on alendronate 70 mg.   07/2022 cholecystectomy.  05/2020 EGD by Dr. Kiang (to evaluate epigastric pain): Normal.   03/2016 last colonoscopy by Dr. Dyane at [facility]: Normal.  No polyps.  Good prep.  10-year repeat (due 03/2026).   PMH: Hyperlipidemia, GERD, sleep apnea, arthritis, migraines, thrombocytopenia.  She is followed by cardiologist Dr. Elmira.  Last saw cardiologist 08/24/2023.  Cardiac evaluation for chest pain unrevealing.  Takes 81 mg aspirin  daily.  Coronary CT  angiogram showed no significant stenosis.  11/14/2023 CTA chest, abdomen, pelvis: No acute abnormality.  Prior cholecystectomy 07/2022.  2.3 cm liver cyst decreased in size.  Current Outpatient Medications  Medication Sig Dispense Refill   Calcium  Carbonate-Vit D-Min (CALCIUM  1200 PO) Take 1 tablet by mouth in the morning and at bedtime.     clopidogrel  (PLAVIX ) 75 MG tablet Take 1 tablet (75 mg total) by mouth daily. 90 tablet 3   famotidine  (PEPCID ) 20 MG tablet Take 1 tablet (20 mg total) by mouth 2 (two) times daily. 60 tablet 5   FOSAMAX 70 MG tablet Take 70 mg by mouth once a week. Monday     lubiprostone  (AMITIZA ) 24 MCG capsule Take 1 capsule (24 mcg total) by mouth 2 (two) times daily with a meal. 60 capsule 5   melatonin 5 MG TABS Take 5 mg by mouth at bedtime as needed.     methimazole  (TAPAZOLE ) 5 MG tablet Take 2.5 mg by mouth daily.     nitroGLYCERIN  (NITROSTAT ) 0.4 MG SL tablet Place 1 tablet (0.4 mg total) under the tongue every 5 (five) minutes as needed for chest pain. 30 tablet 3   ondansetron  (ZOFRAN ) 4 MG tablet Take 4 mg by mouth every 8 (eight) hours as needed for nausea.     pantoprazole  (PROTONIX ) 40 MG tablet Take 1 tablet (40 mg total) by mouth daily. 90 tablet 3   rosuvastatin  (CRESTOR ) 20 MG tablet Take 1 tablet (20 mg total) by mouth daily. 90 tablet 3   traMADol  (ULTRAM )  50 MG tablet Take 1 tablet (50 mg total) by mouth every 4 (four) hours as needed for moderate pain or severe pain. 15 tablet 0   No current facility-administered medications for this visit.    Allergies as of 03/22/2024 - Review Complete 03/22/2024  Allergen Reaction Noted   Alendronate sodium  01/15/2021   Neurontin [gabapentin]  01/15/2021   Sulfa antibiotics  11/27/2023    Past Medical History:  Diagnosis Date   Arthritis    GERD (gastroesophageal reflux disease)    HLD (hyperlipidemia)    Hyperthyroidism    Sleep apnea    Thrombocytopenia     Past Surgical History:  Procedure  Laterality Date   CATARACT EXTRACTION W/ INTRAOCULAR LENS  IMPLANT, BILATERAL     right 8/18 and left was 9/18   CERVICAL ABLATION     irregular pap smear  11/2021   CHOLECYSTECTOMY N/A 07/25/2022   Procedure: LAPAROSCOPIC CHOLECYSTECTOMY;  Surgeon: Sebastian Moles, MD;  Location: [facility] OR;  Service: General;  Laterality: N/A;   COLONOSCOPY     SHOULDER ARTHROSCOPY Right     Review of Systems:    All systems reviewed and negative except where noted in HPI.    Physical Exam:  BP 104/64 (BP Location: Left Arm, Patient Position: Sitting, Cuff Size: Normal)   Pulse 92   Ht 5' 5.5 (1.664 m) Comment: height measured without shoes  Wt 137 lb 8 oz (62.4 kg)   BMI 22.53 kg/m  No LMP recorded. Patient is postmenopausal.  General: Well-nourished, well-developed in no acute distress.  Lungs: Clear to auscultation bilaterally. Non-labored. Heart: Regular rate and rhythm, no murmurs rubs or gallops.  Abdomen: Bowel sounds are normal; Abdomen is Soft; No hepatosplenomegaly, masses or hernias;  No Abdominal Tenderness; No guarding or rebound tenderness. Neuro: Alert and oriented x 3.  Grossly intact.  Psych: Alert and cooperative, normal mood and affect.   Imaging Studies: No results found.  Labs: CBC    Component Value Date/Time   WBC 7.1 11/14/2023 1012   RBC 4.76 11/14/2023 1012   HGB 14.1 11/14/2023 1012   HCT 44.1 11/14/2023 1012   PLT 153 11/14/2023 1012   MCV 92.6 11/14/2023 1012   MCH 29.6 11/14/2023 1012   MCHC 32.0 11/14/2023 1012   RDW 12.9 11/14/2023 1012   LYMPHSABS 0.9 07/23/2022 0607   MONOABS 0.6 07/23/2022 0607   EOSABS 0.2 07/23/2022 0607   BASOSABS 0.0 07/23/2022 0607    CMP     Component Value Date/Time   NA 140 11/14/2023 1012   NA 136 08/24/2023 1647   K 4.2 11/14/2023 1012   CL 104 11/14/2023 1012   CO2 26 11/14/2023 1012   GLUCOSE 153 (H) 11/14/2023 1012   BUN 16 11/14/2023 1012   BUN 12 08/24/2023 1647   CREATININE 1.08 (H) 11/14/2023 1012    CALCIUM  9.6 11/14/2023 1012   PROT 7.2 11/27/2023 1449   ALBUMIN 4.6 11/27/2023 1449   AST 20 11/27/2023 1449   ALT 13 11/27/2023 1449   ALKPHOS 66 11/27/2023 1449   BILITOT 0.5 11/27/2023 1449   GFRNONAA 52 (L) 11/14/2023 1012   GFRAA 53 (L) 09/12/2016 0237       Assessment and Plan:   Heather Kirby is a 78 y.o. y/o female returns for follow-up of:  1.  Epigastric pain, GERD, gastritis (H. pylori negative): Symptoms resolved on PPI and H2 RB. - Continue pantoprazole  40 mg once daily, #90, 3 refills. - Continue Pepcid  20 mg twice  daily, #180, 3 refills. - Recommend Lifestyle Modifications to prevent Acid Reflux.  Rec. Avoid coffee, sodas, peppermint, garlic, onions, alcohol , citrus fruits, chocolate, tomatoes, fatty and spicey foods.  Avoid eating 2-3 hours before bedtime.   - We discussed adverse side effects of PPIs to include vitamin deficiencies, osteoporosis, and renal insufficiency.  Recommend take lowest effective dose of PPI necessary to control acid reflux.  OK to add H2RB (Pepcid  20mg  daily) or antiacid if needed for breakthrough acid reflux.   2.  History of elevated liver transaminases AST greater than ALT.  Recent LFTs returned to normal.  Liver labs unrevealing.  Currently asymptomatic.  Prior cholecystectomy. - Avoid alcohol . - Follow-up if LFTs become elevated again.  3.  Chronic constipation improved and controlled on Amitiza . -Continue Amitiza  24 mcg twice daily, #60, 11 refills. - Recommend High Fiber diet with fruits, vegetables, and whole grains. - Drink 64 ounces of Fluids Daily.    Heather Console, PA-C  Follow up in 1 year to refill medication or sooner if she has recurrent GI symptoms.

## 2024-03-22 ENCOUNTER — Ambulatory Visit: Admitting: Physician Assistant

## 2024-03-22 ENCOUNTER — Encounter: Payer: Self-pay | Admitting: Physician Assistant

## 2024-03-22 VITALS — BP 104/64 | HR 92 | Ht 65.5 in | Wt 137.5 lb

## 2024-03-22 DIAGNOSIS — K219 Gastro-esophageal reflux disease without esophagitis: Secondary | ICD-10-CM

## 2024-03-22 DIAGNOSIS — K5904 Chronic idiopathic constipation: Secondary | ICD-10-CM

## 2024-03-22 MED ORDER — PANTOPRAZOLE SODIUM 40 MG PO TBEC: 40.0000 mg | DELAYED_RELEASE_TABLET | Freq: Every day | ORAL | 3 refills | Status: AC

## 2024-03-22 MED ORDER — FAMOTIDINE 20 MG PO TABS: 20.0000 mg | ORAL_TABLET | Freq: Two times a day (BID) | ORAL | 3 refills | Status: AC

## 2024-03-22 MED ORDER — LUBIPROSTONE 24 MCG PO CAPS
24.0000 ug | ORAL_CAPSULE | Freq: Two times a day (BID) | ORAL | 11 refills | Status: AC
Start: 2024-03-22 — End: 2025-03-17

## 2024-03-22 NOTE — Patient Instructions (Addendum)
 We have sent the following medications to your pharmacy for you to pick up at your convenience: Famotidine  20 mg twice daily, Amitiza  24 mcg twice daily with meals and Pantoprazole  40 mg once daily  Please follow up sooner if symptoms increase or worsen  Due to recent changes in healthcare laws, you may see the results of your imaging and laboratory studies on MyChart before your provider has had a chance to review them.  We understand that in some cases there may be results that are confusing or concerning to you. Not all laboratory results come back in the same time frame and the provider may be waiting for multiple results in order to interpret others.  Please give us  48 hours in order for your provider to thoroughly review all the results before contacting the office for clarification of your results.   Thank you for trusting me with your gastrointestinal care!   Ellouise Console, PA-C _______________________________________________________  If your blood pressure at your visit was 140/90 or greater, please contact your primary care physician to follow up on this.  _______________________________________________________  If you are age 79 or older, your body mass index should be between 23-30. Your Body mass index is 22.53 kg/m. If this is out of the aforementioned range listed, please consider follow up with your Primary Care Provider.  If you are age 41 or younger, your body mass index should be between 19-25. Your Body mass index is 22.53 kg/m. If this is out of the aformentioned range listed, please consider follow up with your Primary Care Provider.   ________________________________________________________  The [facility] providers would like to encourage you to use MYCHART to communicate with providers for non-urgent requests or questions.  Due to long hold times on the telephone, sending your provider a message by [facility] may be a faster and more efficient way to get a response.   Please allow 48 business hours for a response.  Please remember that this is for non-urgent requests.  _______________________________________________________

## 2024-03-26 NOTE — Progress Notes (Signed)
 Noted

## 2024-04-10 DIAGNOSIS — H43813 Vitreous degeneration, bilateral: Secondary | ICD-10-CM | POA: Diagnosis not present

## 2024-04-16 ENCOUNTER — Encounter: Payer: Self-pay | Admitting: Podiatry

## 2024-04-16 ENCOUNTER — Ambulatory Visit: Admitting: Podiatry

## 2024-04-16 DIAGNOSIS — D689 Coagulation defect, unspecified: Secondary | ICD-10-CM | POA: Diagnosis not present

## 2024-04-16 DIAGNOSIS — M79676 Pain in unspecified toe(s): Secondary | ICD-10-CM

## 2024-04-16 DIAGNOSIS — B351 Tinea unguium: Secondary | ICD-10-CM | POA: Diagnosis not present

## 2024-04-16 NOTE — Progress Notes (Signed)
 Presents today chief complaint of painful elongated toenails.  Objective: Pulses are palpable no open lesions or wounds.  Toenails are long thick yellow dystrophic onychomycotic.  Assessment: Pain limb secondary onychomycosis.  Plan: Debridement of toenails 1 through 5 bilateral.

## 2024-07-16 ENCOUNTER — Ambulatory Visit: Admitting: Podiatry
